# Patient Record
Sex: Male | Born: 1975 | Race: White | Hispanic: No | Marital: Married | Smoking: Former smoker
Health system: Southern US, Community
[De-identification: ages and names within clinical notes are randomized; demographics above are authoritative.]

## PROBLEM LIST (undated history)

## (undated) DIAGNOSIS — M7540 Impingement syndrome of unspecified shoulder: Secondary | ICD-10-CM

## (undated) DIAGNOSIS — Z8279 Family history of other congenital malformations, deformations and chromosomal abnormalities: Secondary | ICD-10-CM

## (undated) DIAGNOSIS — Z87442 Personal history of urinary calculi: Secondary | ICD-10-CM

## (undated) HISTORY — PX: SHOULDER ARTHROSCOPY W/ ROTATOR CUFF REPAIR: SHX2400

## (undated) HISTORY — PX: TONSILLECTOMY: SUR1361

## (undated) HISTORY — PX: KNEE ARTHROSCOPY: SUR90

---

## 2006-04-28 ENCOUNTER — Ambulatory Visit: Payer: Self-pay | Admitting: Sports Medicine

## 2008-02-28 ENCOUNTER — Ambulatory Visit: Payer: Self-pay | Admitting: Sports Medicine

## 2008-02-28 DIAGNOSIS — M79609 Pain in unspecified limb: Secondary | ICD-10-CM

## 2008-04-29 ENCOUNTER — Ambulatory Visit: Payer: Self-pay | Admitting: Sports Medicine

## 2009-08-18 ENCOUNTER — Ambulatory Visit: Payer: Self-pay | Admitting: Family Medicine

## 2009-08-18 DIAGNOSIS — M549 Dorsalgia, unspecified: Secondary | ICD-10-CM | POA: Insufficient documentation

## 2009-12-01 ENCOUNTER — Ambulatory Visit: Payer: Self-pay | Admitting: Family Medicine

## 2009-12-01 DIAGNOSIS — M7660 Achilles tendinitis, unspecified leg: Secondary | ICD-10-CM

## 2010-04-13 NOTE — Assessment & Plan Note (Signed)
Summary: Achilles INJURY,MC   History of Present Illness: 35 y/o M here for right posterior heel pain  Patient is training for a race next month and a marathon in november He runs barefoot (not with barefoot shoes). About 3-5 miles into his run today started having pain in right achilles. Is a known forefoot striker as well. Had to stop and walk due to pain Pain only when he was running today - not when walking - only mildly painful now Did not feel a pop or acute injury No swelling or bruising. Has not tried anything for this yet.  Medications Prior to Update: 1)  None  Allergies (verified): No Known Drug Allergies  Past History:  Physical Exam  General:  NAD, alert and oriented, friendly and cooperative Msk:  R foot/ankle: No gross deformity, swelling, or bruising. Achilles is intact with negative thompsons test. TTP about 2 cm prox to insertion on calcaneus - less ttp within retrocalcaneal bursa. No TTP plantar fascia Pain in achilles worsened by passive dorsiflexion at ankle. NVI distally.   Impression & Recommendations:  Problem # 1:  ACHILLES BURSITIS OR TENDINITIS (ICD-726.71) Assessment New Strain of achilles tendon without tear.  Start home exercise program, can use heel lifts in regular shoes.  Activity as tolerated - drop running intensity and distance and slowly build up about 10% at a time.  Take tomorrow off.  Cross train as well.  Icing, aleve or tylenol for pain.  Consider nitro patches if not improving.  He does not wear shoes when running so orthotics not an option - wants to continue with barefoot running instead of buying a pair of shoes.  Patient Instructions: 1)  You strained your achilles tendon. 2)  Tylenol 1000mg  three times a day and/or aleve 2 tabs twice a day to help with pain if needed. 3)  Lowering/raise on a step exercises 3 x 15 once or twice a day - two feet first then one (start with 3 x 6) 4)  Can add heel walks, toe walks forward and  backward as well. 5)  Ice bucket 10-15 minutes at end of day - can ice 3-4 times a day. 6)  Try to avoid uneven ground, hills. 7)  Heel lifts in your regular shoes. 8)  Running is ok with this to tolerance - Since this started bothering you around 3-5 miles, I would take tomorrow off then go to 2 miles - walk/jog if you have to.  Then slowly build your mileage back up.   9)  Ok to keep building up mileage as long as your pain is less than a 3/10 and you are not limping. 10)  Follow up with me in 4 weeks if not improving - there are a couple other things we can try.

## 2010-04-13 NOTE — Assessment & Plan Note (Signed)
Summary: TO SEE HUDNALL,CALF PAIN,MC   Vital Signs:  Patient profile:   35 year old male BP sitting:   145 / 90  Vitals Entered By: Lillia Pauls CMA (August 18, 2009 2:35 PM)  History of Present Illness: 35 yo M here with left calf pain and left upper back pain  1. Left calf pain Has prior history 1 year ago of calf tear States this time about 3 weeks ago he did not have an acute injury He does recall the run after this started he picked up his pace because his child started crying in the running stroller and did this for about 2 miles. Feels pain and tightness lateral left calf worse when running - pain started last run a few weeks ago about 6 miles into run so stopped has rested since then. Has been doing stretches and exercises on step he was shown previously Does not have a calf sleeve Has 1 pair of heel lifts left but he runs barefoot. No swelling or bruising in calf or left leg.  2. Upper back pain Started when backpacking > 1 week ago wearing a heavy pack Knot in between shoulder blades on left side Had massage which helped but knot returned Tender to palpation No radiation from here but does hurt mildly when turning head to left within left paraspinal area No numbness/tingling No bowel/bladder issues.  Allergies (verified): No Known Drug Allergies  Physical Exam  General:  NAD, alert and oriented, friendly and cooperative Msk:  L calf: TTP within lateral gastroc prox-mid junction.  No other TTP about knee, lower leg, or ankle. No swelling, bruising. Negative homan's sign. Able to stand on toes. FROM ankle and knee. NV intact distally.  Upper back: No gross deformity, swelling, or bruising. TTP just medial to left scapula in paraspinal thoracic region with palpable spasm. Can feel some with robbery exercise Strength 5/5 BUEs Reflexes 1+ and equal bilateral biceps, triceps, brachioradialis tendons Sensation intact to light touch.   Impression &  Recommendations:  Problem # 1:  CALF PAIN, LEFT (ICD-729.5) Assessment Deteriorated L calf strain without tear noted on ultrasound and no increased doppler flow.  Calf sleeve, eccentric and heel raise exercises and calf raises on ground with toes in, regular, and out.  Exercise as tolerated - focus on x-training.  Heel lifts, icing, avoiding hills and inclines/declines.  Orders: Garment,belt,sleeve or other covering ,elastic or similar stretch ([id number])  Problem # 2:  BACK PAIN, UPPER (ICD-724.5) Assessment: New Scapular stabilization exercises and stretches.  Massage, heat.  Patient Instructions: 1)  Wear the calf sleeve for compression 2)  Heel lifts in shoes when you wear shoes 3)  Cross train if possible with elliptical and cycling. 4)  Focus on heel raises and lowering with toes in, neutral, and pointed out. 5)  Ice up to at a time. 6)  Tylenol and/or ibuprofen for pain. 7)  Avoid hill running and incline running if possible.  Try to run on flat surfaces or flat treadmill and slowly increase your mileage over a period of 6 weeks back to where you were - make sure you are wearing the sleeve. 8)  Do a couple scapular exercises shown on the handout at the same time you do the calf exercises (lawnmower, robbery). 9)  Follow up with [place] in 6 weeks for a recheck.

## 2011-04-25 ENCOUNTER — Encounter: Payer: Self-pay | Admitting: Family Medicine

## 2011-04-25 ENCOUNTER — Ambulatory Visit (INDEPENDENT_AMBULATORY_CARE_PROVIDER_SITE_OTHER): Payer: BC Managed Care – PPO | Admitting: Family Medicine

## 2011-04-25 VITALS — BP 133/79 | HR 45 | Temp 98.1°F | Ht 70.0 in | Wt 181.0 lb

## 2011-04-25 DIAGNOSIS — M25562 Pain in left knee: Secondary | ICD-10-CM | POA: Insufficient documentation

## 2011-04-25 DIAGNOSIS — M25569 Pain in unspecified knee: Secondary | ICD-10-CM

## 2011-04-25 NOTE — Assessment & Plan Note (Signed)
2/2 patellar tendinopathy, questionable PF syndrome/developing plica, less likely PF DJD.  Reassured patient.  No injury to suggest bony injury.  Negative ligamentous and meniscal exam.  Tenderness patellar tendon reproduces his pain though grinding is posterior to patella.  Start home exercise program, icing, tylenol/nsaids as needed.  Decrease activity to about 50% - already has gained benefits from long runs up to this point and want him to be healthy as possible going in to marathon on Saturday.  Tried chopat but this did not feel comfortable.  If not improving, consider formal PT, nitro patches, plica injection depending on his exam at f/u.  See instructions for further.

## 2011-04-25 NOTE — Progress Notes (Signed)
  Subjective:    Patient ID: Vale Mousseau, male    DOB: February 21, 1976, 36 y.o.   MRN: [id number]  PCP: None  HPI 36 yo M here for left knee pain.  Patient is currently training for a marathon this Saturday. Runs 40-60 miles a week. States for past 1-2 weeks has noticed a grinding sound anterior left knee but without pain. Then yesterday started to get intermittent brief sharp stabbing pain anterior left knee about patellar tendon area, some behind kneecap. No swelling or bruising. Not icing or taking medications. Denies known injury. No catching, locking, giving out.    History reviewed. No pertinent past medical history.  No current outpatient prescriptions on file prior to visit.    Past Surgical History  Procedure Date  . Knee surgery     No Known Allergies  History   Social History  . Marital Status: Married    Spouse Name: N/A    Number of Children: N/A  . Years of Education: N/A   Occupational History  . Not on file.   Social History Main Topics  . Smoking status: Never Smoker   . Smokeless tobacco: Not on file  . Alcohol Use: Not on file  . Drug Use: Not on file  . Sexually Active: Not on file   Other Topics Concern  . Not on file   Social History Narrative  . No narrative on file    Family History  Problem Relation Age of Onset  . Sudden death Neg Hx   . Diabetes Neg Hx   . Heart attack Neg Hx   . Hyperlipidemia Neg Hx   . Hypertension Neg Hx     BP 133/79  Pulse 45  Temp(Src) 98.1 F (36.7 C) (Oral)  Ht 5\' 10"  (1.778 m)  Wt 181 lb (82.101 kg)  BMI 25.97 kg/m2  Review of Systems See HPI above.    Objective:   Physical Exam Gen: NAD  L knee: No gross deformity, ecchymoses, swelling.  Crepitation from flexion to extension (grinding posterior to patella). No TTP joint lines, post patellar facets, pes.  TTP patellar tendon reproducing pain he has anteriorly.. FROM. Negative ant/post drawers. Negative valgus/varus testing. Negative  lachmanns. Negative mcmurrays, apleys, patellar apprehension.  Mild + clarkes. NV intact distally. Hip abduction 5/5  R knee: FROM without pain, instability.    Assessment & Plan:  1. Left knee pain - 2/2 patellar tendinopathy, questionable PF syndrome/developing plica, less likely PF DJD.  Reassured patient.  No injury to suggest bony injury.  Negative ligamentous and meniscal exam.  Tenderness patellar tendon reproduces his pain though grinding is posterior to patella.  Start home exercise program, icing, tylenol/nsaids as needed.  Decrease activity to about 50% - already has gained benefits from long runs up to this point and want him to be healthy as possible going in to marathon on Saturday.  Tried chopat but this did not feel comfortable.  If not improving, consider formal PT, nitro patches, plica injection depending on his exam at f/u.  See instructions for further.

## 2011-04-25 NOTE — Patient Instructions (Signed)
You have patellar tendinitis and developing plica (band of soft tissue behind side of kneecap that crunches and catches). Avoid painful activities when possible In general cut your typical runs by 50% - increase mileage by about 10% per week while doing the rehab. If pain is less than 3 on a scale of 1-10 and you're not limping, can continue to progress running. Tylenol and/or aleve as needed for pain Icing 15 minutes at a time after activities. For rehab - decline squat, straight leg raise, inside straight leg raise, hip abduction 3 sets of 10 once a day. Can consider cortisone injection to area where plica is rubbing (I generally do not recommend this), formal physical therapy, nitro patches if not improving as expected. Follow up with me in 1 month or as needed. Good luck in the race on Saturday!

## 2012-04-14 DIAGNOSIS — Z87442 Personal history of urinary calculi: Secondary | ICD-10-CM

## 2012-04-14 HISTORY — DX: Personal history of urinary calculi: Z87.442

## 2012-08-08 ENCOUNTER — Ambulatory Visit (HOSPITAL_BASED_OUTPATIENT_CLINIC_OR_DEPARTMENT_OTHER)
Admission: RE | Admit: 2012-08-08 | Discharge: 2012-08-08 | Disposition: A | Discharge status: Home / Self Care | Payer: BC Managed Care – PPO | Source: Ambulatory Visit | Attending: Family Medicine | Admitting: Family Medicine

## 2012-08-08 ENCOUNTER — Ambulatory Visit (INDEPENDENT_AMBULATORY_CARE_PROVIDER_SITE_OTHER): Payer: BC Managed Care – PPO | Admitting: Family Medicine

## 2012-08-08 ENCOUNTER — Encounter: Payer: Self-pay | Admitting: Family Medicine

## 2012-08-08 VITALS — BP 148/87 | HR 49 | Ht 70.0 in | Wt 195.0 lb

## 2012-08-08 DIAGNOSIS — M25511 Pain in right shoulder: Secondary | ICD-10-CM

## 2012-08-08 DIAGNOSIS — S43431A Superior glenoid labrum lesion of right shoulder, initial encounter: Secondary | ICD-10-CM | POA: Insufficient documentation

## 2012-08-08 DIAGNOSIS — M25519 Pain in unspecified shoulder: Secondary | ICD-10-CM

## 2012-08-08 DIAGNOSIS — M19019 Primary osteoarthritis, unspecified shoulder: Secondary | ICD-10-CM | POA: Insufficient documentation

## 2012-08-08 NOTE — Patient Instructions (Addendum)
Your history and exam are concerning for right shoulder instability (subluxation) and labral tear. Get x-rays today then we will do an MRI arthrogram to assess for these issues. In meantime ice shoulder 15 minutes at a time as needed. Ibuprofen 600mg  three times a day with food OR aleve 2 tabs twice a day with food for pain and inflammation. Avoid swimming but it's ok to continue running. Start home exercise program - 3 sets of 10 each exercise and do only once daily. Check with your insurance about % covered and if they have a preferred imaging place locally [facility], [facility], [facility], [facility] are the common ones in the area). Procedure code for the MRI is 73222; ICD-9 code is 719.41.

## 2012-08-08 NOTE — Progress Notes (Addendum)
Subjective:    Patient ID: Christian Horne, male    DOB: 02-07-1976, 37 y.o.   MRN: [id number]  PCP: None  Shoulder Pain    37 yo M here for right shoulder pain.  Patient reports in July 2013 while swimming he felt a loud pop in right shoulder. A lot of pain and could not use shoulder for over a week due to this. Didn't resolve for 1-2 months. Had 3 other injuries since then - backpacking February 2014 it popped again causing pain, inability to use shoulder for over a week. Then a couple times since including this weekend while swimming gets a deep pain in right shoulder, unable to use arm. No swelling or bruising. Since initial injury gets catching, locking, clicking of shoulder. Decreased strength in right shoulder, difficulty lying on right side. Is right handed.  History reviewed. No pertinent past medical history.  No current outpatient prescriptions on file prior to visit.   No current facility-administered medications on file prior to visit.    Past Surgical History  Procedure Laterality Date  . Knee surgery      No Known Allergies  History   Social History  . Marital Status: Married    Spouse Name: N/A    Number of Children: N/A  . Years of Education: N/A   Occupational History  . Not on file.   Social History Main Topics  . Smoking status: Former Smoker    Quit date: 03/14/2000  . Smokeless tobacco: Not on file  . Alcohol Use: Not on file  . Drug Use: Not on file  . Sexually Active: Not on file   Other Topics Concern  . Not on file   Social History Narrative  . No narrative on file    Family History  Problem Relation Age of Onset  . Sudden death Neg Hx   . Diabetes Neg Hx   . Heart attack Neg Hx   . Hyperlipidemia Neg Hx   . Hypertension Neg Hx     BP 148/87  Pulse 49  Ht 5\' 10"  (1.778 m)  Wt 195 lb (88.451 kg)  BMI 27.98 kg/m2  Review of Systems  See HPI above.    Objective:   Physical Exam  Gen: NAD  R shoulder: No swelling,  ecchymoses.  No gross deformity. No TTP AC joint, biceps tendon. FROM with mild painful arc. Negative Hawkins, Neers. Negative Speeds, Yergasons. Strength 5/5 with empty can and resisted internal/external rotation.  Minimal pain with IR and empty can. Positive o'briens Pain with apprehension. NV intact distally.     Assessment & Plan:  1. Right shoulder pain - Based on his history and exam (starting with pop and acute injury while swimming) and subsequent injuries, popping, clicking deep in shoulder this is concerning for a shoulder subluxation with labral tear.  He has done some home exercises on own but struggles with persistent issues.  Discussed options (PT, conservative care vs imaging) and advised we go ahead with MR arthrogram to assess for bankart lesion, labral tear.  Radiographs negative for acute bony abnormalities.  Addendum 6/9:  MRI results reviewed and discussed with patient.  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  Also with a small labral tear anteriorly - these all in the area most commonly associated with subluxation/dislocation.  Has some loose bodies that are in the subcoracoid recess, are very small.  Discussed his options.  He  reports pain has worsened over the weekend simply doing one day of the theraband strengthening exercises (3 exercises, 3 sets of 10) with increased catching and locked at one point.  Discussed intraarticular injection, physical therapy, referral to shoulder specialist.  Given what he describes, mechanical symptoms, and great difficulty with home exercises will go ahead with referral to discuss next steps, possible arthroscopy.

## 2012-08-08 NOTE — Assessment & Plan Note (Signed)
Based on his history and exam (starting with pop and acute injury while swimming) and subsequent injuries, popping, clicking deep in shoulder this is concerning for a shoulder subluxation with labral tear.  He has done some home exercises on own but struggles with persistent issues.  Discussed options (PT, conservative care vs imaging) and advised we go ahead with MR arthrogram to assess for bankart lesion, labral tear.  Radiographs negative for acute bony abnormalities.

## 2012-08-13 ENCOUNTER — Other Ambulatory Visit (HOSPITAL_COMMUNITY): Payer: BC Managed Care – PPO

## 2012-08-17 ENCOUNTER — Ambulatory Visit (HOSPITAL_COMMUNITY)
Admission: RE | Admit: 2012-08-17 | Discharge: 2012-08-17 | Disposition: A | Discharge status: Home / Self Care | Payer: BC Managed Care – PPO | Source: Ambulatory Visit | Attending: Family Medicine | Admitting: Family Medicine

## 2012-08-17 DIAGNOSIS — M24819 Other specific joint derangements of unspecified shoulder, not elsewhere classified: Secondary | ICD-10-CM | POA: Insufficient documentation

## 2012-08-17 DIAGNOSIS — M25511 Pain in right shoulder: Secondary | ICD-10-CM

## 2012-08-17 DIAGNOSIS — M25519 Pain in unspecified shoulder: Secondary | ICD-10-CM | POA: Insufficient documentation

## 2012-08-17 DIAGNOSIS — M19019 Primary osteoarthritis, unspecified shoulder: Secondary | ICD-10-CM | POA: Insufficient documentation

## 2012-08-17 MED ORDER — GADOBENATE DIMEGLUMINE 529 MG/ML IV SOLN
5.0000 mL | Freq: Once | INTRAVENOUS | Status: AC | PRN
  Administered 2012-08-17: 5 mL via INTRAVENOUS

## 2012-08-17 MED ORDER — GADOBENATE DIMEGLUMINE 529 MG/ML IV SOLN
5.0000 mL | Freq: Once | INTRAVENOUS | Status: AC | PRN
  Administered 2012-08-17: 1 mL via INTRAVENOUS

## 2012-08-17 MED ORDER — IOHEXOL 300 MG/ML  SOLN
50.0000 mL | Freq: Once | INTRAMUSCULAR | Status: AC | PRN
  Administered 2012-08-17: 10 mL

## 2012-08-20 ENCOUNTER — Other Ambulatory Visit: Payer: Self-pay | Admitting: *Deleted

## 2012-08-20 MED ORDER — HYDROCODONE-ACETAMINOPHEN 5-325 MG PO TABS: 1.0000 | ORAL_TABLET | Freq: Four times a day (QID) | ORAL | Status: DC | PRN

## 2012-08-20 NOTE — Addendum Note (Signed)
Addended by: Lenda Kelp on: 08/20/2012 09:34 AM   Modules accepted: Orders

## 2012-10-10 ENCOUNTER — Other Ambulatory Visit: Payer: Self-pay | Admitting: Orthopedic Surgery

## 2012-10-12 DIAGNOSIS — M7540 Impingement syndrome of unspecified shoulder: Secondary | ICD-10-CM

## 2012-10-12 HISTORY — DX: Impingement syndrome of unspecified shoulder: M75.40

## 2012-10-19 ENCOUNTER — Encounter (HOSPITAL_BASED_OUTPATIENT_CLINIC_OR_DEPARTMENT_OTHER): Payer: Self-pay | Admitting: *Deleted

## 2012-10-26 ENCOUNTER — Ambulatory Visit (HOSPITAL_BASED_OUTPATIENT_CLINIC_OR_DEPARTMENT_OTHER): Payer: BC Managed Care – PPO | Admitting: Anesthesiology

## 2012-10-26 ENCOUNTER — Encounter (HOSPITAL_BASED_OUTPATIENT_CLINIC_OR_DEPARTMENT_OTHER)
Admission: RE | Disposition: A | Discharge status: Home / Self Care | Payer: Self-pay | Source: Ambulatory Visit | Attending: Orthopedic Surgery

## 2012-10-26 ENCOUNTER — Encounter (HOSPITAL_BASED_OUTPATIENT_CLINIC_OR_DEPARTMENT_OTHER): Payer: Self-pay | Admitting: Anesthesiology

## 2012-10-26 ENCOUNTER — Ambulatory Visit (HOSPITAL_BASED_OUTPATIENT_CLINIC_OR_DEPARTMENT_OTHER)
Admission: RE | Admit: 2012-10-26 | Discharge: 2012-10-26 | Disposition: A | Discharge status: Home / Self Care | Payer: BC Managed Care – PPO | Source: Ambulatory Visit | Attending: Orthopedic Surgery | Admitting: Orthopedic Surgery

## 2012-10-26 DIAGNOSIS — X58XXXA Exposure to other specified factors, initial encounter: Secondary | ICD-10-CM | POA: Insufficient documentation

## 2012-10-26 DIAGNOSIS — Z87891 Personal history of nicotine dependence: Secondary | ICD-10-CM | POA: Insufficient documentation

## 2012-10-26 DIAGNOSIS — S43439A Superior glenoid labrum lesion of unspecified shoulder, initial encounter: Secondary | ICD-10-CM | POA: Insufficient documentation

## 2012-10-26 DIAGNOSIS — Y929 Unspecified place or not applicable: Secondary | ICD-10-CM | POA: Insufficient documentation

## 2012-10-26 DIAGNOSIS — S43431A Superior glenoid labrum lesion of right shoulder, initial encounter: Secondary | ICD-10-CM

## 2012-10-26 DIAGNOSIS — M24019 Loose body in unspecified shoulder: Secondary | ICD-10-CM | POA: Insufficient documentation

## 2012-10-26 DIAGNOSIS — M19019 Primary osteoarthritis, unspecified shoulder: Secondary | ICD-10-CM | POA: Insufficient documentation

## 2012-10-26 HISTORY — DX: Personal history of urinary calculi: Z87.442

## 2012-10-26 HISTORY — DX: Family history of other congenital malformations, deformations and chromosomal abnormalities: Z82.79

## 2012-10-26 HISTORY — PX: SHOULDER ARTHROSCOPY: SHX128

## 2012-10-26 HISTORY — DX: Impingement syndrome of unspecified shoulder: M75.40

## 2012-10-26 LAB — POCT HEMOGLOBIN-HEMACUE: Hemoglobin: 14.9 g/dL (ref 13.0–17.0)

## 2012-10-26 SURGERY — ARTHROSCOPY, SHOULDER
Anesthesia: General | Site: Shoulder | Laterality: Right | Wound class: Clean

## 2012-10-26 MED ORDER — PROMETHAZINE HCL 25 MG PO TABS: 25.0000 mg | ORAL_TABLET | Freq: Four times a day (QID) | ORAL | Status: AC | PRN

## 2012-10-26 MED ORDER — LACTATED RINGERS IV SOLN
INTRAVENOUS | Status: DC
  Administered 2012-10-26: 09:00:00 via INTRAVENOUS

## 2012-10-26 MED ORDER — OXYCODONE-ACETAMINOPHEN 5-325 MG PO TABS: 1.0000 | ORAL_TABLET | Freq: Four times a day (QID) | ORAL | Status: AC | PRN

## 2012-10-26 MED ORDER — SENNA-DOCUSATE SODIUM 8.6-50 MG PO TABS: 2.0000 | ORAL_TABLET | Freq: Every day | ORAL | Status: AC

## 2012-10-26 MED ORDER — FENTANYL CITRATE 0.05 MG/ML IJ SOLN
50.0000 ug | INTRAMUSCULAR | Status: DC | PRN
  Administered 2012-10-26: 100 ug via INTRAVENOUS

## 2012-10-26 MED ORDER — METHOCARBAMOL 500 MG PO TABS: 500.0000 mg | ORAL_TABLET | Freq: Four times a day (QID) | ORAL | Status: DC

## 2012-10-26 MED ORDER — SODIUM CHLORIDE 0.9 % IR SOLN
Status: DC | PRN
  Administered 2012-10-26: 6000 mL

## 2012-10-26 MED ORDER — CEFAZOLIN SODIUM-DEXTROSE 2-3 GM-% IV SOLR
2.0000 g | INTRAVENOUS | Status: AC
  Administered 2012-10-26: 2 g via INTRAVENOUS

## 2012-10-26 MED ORDER — BUPIVACAINE-EPINEPHRINE PF 0.5-1:200000 % IJ SOLN
INTRAMUSCULAR | Status: DC | PRN
  Administered 2012-10-26: 25 mL

## 2012-10-26 MED ORDER — MIDAZOLAM HCL 2 MG/2ML IJ SOLN
1.0000 mg | INTRAMUSCULAR | Status: DC | PRN
  Administered 2012-10-26: 2 mg via INTRAVENOUS

## 2012-10-26 MED ORDER — SUCCINYLCHOLINE CHLORIDE 20 MG/ML IJ SOLN
INTRAMUSCULAR | Status: DC | PRN
  Administered 2012-10-26: 100 mg via INTRAVENOUS

## 2012-10-26 MED ORDER — PROPOFOL 10 MG/ML IV BOLUS
INTRAVENOUS | Status: DC | PRN
  Administered 2012-10-26: 100 mg via INTRAVENOUS
  Administered 2012-10-26: 200 mg via INTRAVENOUS

## 2012-10-26 MED ORDER — MIDAZOLAM HCL 2 MG/ML PO SYRP: 12.0000 mg | ORAL_SOLUTION | Freq: Once | ORAL | Status: DC | PRN

## 2012-10-26 MED ORDER — DEXAMETHASONE SODIUM PHOSPHATE 4 MG/ML IJ SOLN
INTRAMUSCULAR | Status: DC | PRN
  Administered 2012-10-26: 10 mg via INTRAVENOUS
  Administered 2012-10-26: 4 mg

## 2012-10-26 MED ORDER — LIDOCAINE HCL (CARDIAC) 20 MG/ML IV SOLN
INTRAVENOUS | Status: DC | PRN
  Administered 2012-10-26: 80 mg via INTRAVENOUS

## 2012-10-26 SURGICAL SUPPLY — 64 items
BENZOIN TINCTURE PRP APPL 2/3 (GAUZE/BANDAGES/DRESSINGS) ×2 IMPLANT
BLADE CUTTER GATOR 3.5 (BLADE) ×2 IMPLANT
BLADE GREAT WHITE 4.2 (BLADE) IMPLANT
BLADE SURG 15 STRL LF DISP TIS (BLADE) ×1 IMPLANT
BLADE SURG 15 STRL SS (BLADE) ×1
BUR OVAL 4.0 (BURR) IMPLANT
BUR OVAL 6.0 (BURR) IMPLANT
CANISTER OMNI JUG 16 LITER (MISCELLANEOUS) ×2 IMPLANT
CANNULA 5.75X71 LONG (CANNULA) ×2 IMPLANT
CANNULA TWIST IN 8.25X7CM (CANNULA) IMPLANT
CLOTH BEACON ORANGE TIMEOUT ST (SAFETY) ×2 IMPLANT
DECANTER SPIKE VIAL GLASS SM (MISCELLANEOUS) IMPLANT
DRAPE INCISE IOBAN 66X45 STRL (DRAPES) ×2 IMPLANT
DRAPE SHOULDER BEACH CHAIR (DRAPES) ×2 IMPLANT
DRAPE U 20/CS (DRAPES) ×2 IMPLANT
DRAPE U-SHAPE 47X51 STRL (DRAPES) ×2 IMPLANT
DRSG PAD ABDOMINAL 8X10 ST (GAUZE/BANDAGES/DRESSINGS) ×2 IMPLANT
DURAPREP 26ML APPLICATOR (WOUND CARE) ×2 IMPLANT
ELECT REM PT RETURN 9FT ADLT (ELECTROSURGICAL)
ELECTRODE REM PT RTRN 9FT ADLT (ELECTROSURGICAL) IMPLANT
GLOVE BIO SURGEON STRL SZ8 (GLOVE) ×4 IMPLANT
GLOVE BIOGEL PI IND STRL 7.0 (GLOVE) ×1 IMPLANT
GLOVE BIOGEL PI IND STRL 8 (GLOVE) ×2 IMPLANT
GLOVE BIOGEL PI INDICATOR 7.0 (GLOVE) ×1
GLOVE BIOGEL PI INDICATOR 8 (GLOVE) ×2
GLOVE ECLIPSE 6.5 STRL STRAW (GLOVE) ×2 IMPLANT
GLOVE EXAM NITRILE LRG STRL (GLOVE) ×2 IMPLANT
GLOVE ORTHO TXT STRL SZ7.5 (GLOVE) ×2 IMPLANT
GOWN BRE IMP PREV XXLGXLNG (GOWN DISPOSABLE) ×4 IMPLANT
GOWN PREVENTION PLUS XLARGE (GOWN DISPOSABLE) ×2 IMPLANT
IMMOBILIZER SHOULDER XLGE (ORTHOPEDIC SUPPLIES) IMPLANT
IV NS IRRIG 3000ML ARTHROMATIC (IV SOLUTION) ×4 IMPLANT
KIT SHOULDER TRACTION (DRAPES) ×2 IMPLANT
LASSO SUT 90 DEGREE (SUTURE) IMPLANT
PACK ARTHROSCOPY DSU (CUSTOM PROCEDURE TRAY) ×2 IMPLANT
PACK BASIN DAY SURGERY FS (CUSTOM PROCEDURE TRAY) ×2 IMPLANT
SET ARTHROSCOPY TUBING (MISCELLANEOUS) ×1
SET ARTHROSCOPY TUBING LN (MISCELLANEOUS) ×1 IMPLANT
SHEET MEDIUM DRAPE 40X70 STRL (DRAPES) ×2 IMPLANT
SLEEVE SCD COMPRESS KNEE MED (MISCELLANEOUS) ×2 IMPLANT
SLING ARM FOAM STRAP LRG (SOFTGOODS) IMPLANT
SLING ARM FOAM STRAP MED (SOFTGOODS) IMPLANT
SLING ARM FOAM STRAP XLG (SOFTGOODS) IMPLANT
SLING ARM IMMOBILIZER LRG (SOFTGOODS) ×2 IMPLANT
SLING ARM IMMOBILIZER MED (SOFTGOODS) IMPLANT
SPONGE GAUZE 4X4 12PLY (GAUZE/BANDAGES/DRESSINGS) ×2 IMPLANT
STRIP CLOSURE SKIN 1/2X4 (GAUZE/BANDAGES/DRESSINGS) ×2 IMPLANT
SUT FIBERWIRE #2 38 T-5 BLUE (SUTURE)
SUT LASSO 45 DEGREE (SUTURE) IMPLANT
SUT LASSO 45 DEGREE LEFT (SUTURE) IMPLANT
SUT LASSO 45D RIGHT (SUTURE) IMPLANT
SUT MNCRL AB 4-0 PS2 18 (SUTURE) IMPLANT
SUT PDS AB 1 CT  36 (SUTURE)
SUT PDS AB 1 CT 36 (SUTURE) IMPLANT
SUT TIGER TAPE 7 IN WHITE (SUTURE) IMPLANT
SUT VIC AB 3-0 SH 27 (SUTURE)
SUT VIC AB 3-0 SH 27X BRD (SUTURE) IMPLANT
SUTURE FIBERWR #2 38 T-5 BLUE (SUTURE) IMPLANT
TAPE FIBER 2MM 7IN #2 BLUE (SUTURE) IMPLANT
TOWEL OR 17X24 6PK STRL BLUE (TOWEL DISPOSABLE) ×2 IMPLANT
TOWEL OR NON WOVEN STRL DISP B (DISPOSABLE) ×2 IMPLANT
TUBE CONNECTING 20X1/4 (TUBING) IMPLANT
WAND STAR VAC 90 (SURGICAL WAND) ×2 IMPLANT
WATER STERILE IRR 1000ML POUR (IV SOLUTION) ×2 IMPLANT

## 2012-10-26 NOTE — Progress Notes (Signed)
Assisted Dr. Crews with right, ultrasound guided, interscalene  block. Side rails up, monitors on throughout procedure. See vital signs in flow sheet. Tolerated Procedure well. 

## 2012-10-26 NOTE — Anesthesia Procedure Notes (Addendum)
Anesthesia Regional Block:  Interscalene brachial plexus block  Pre-Anesthetic Checklist: ,, timeout performed, Correct Patient, Correct Site, Correct Laterality, Correct Procedure, Correct Position, site marked, Risks and benefits discussed,  Surgical consent,  Pre-op evaluation,  At surgeon's request and post-op pain management  Laterality: Right and Upper  Prep: chloraprep       Needles:  Injection technique: Single-shot  Needle Type: Echogenic Needle     Needle Length: 5cm 5 cm Needle Gauge: 21    Additional Needles:  Procedures: ultrasound guided (picture in chart) Interscalene brachial plexus block Narrative:  Start time: 10/26/2012 10:19 AM End time: 10/26/2012 10:25 AM Injection made incrementally with aspirations every 5 mL.  Performed by: Personally  Anesthesiologist: Sheldon Silvan, MD  Interscalene brachial plexus block Procedure Name: Intubation Performed by: Burna Cash Pre-anesthesia Checklist: Patient identified, Emergency Drugs available, Suction available and Patient being monitored Patient Re-evaluated:Patient Re-evaluated prior to inductionOxygen Delivery Method: Circle System Utilized Preoxygenation: Pre-oxygenation with 100% oxygen Intubation Type: IV induction Ventilation: Mask ventilation without difficulty Tube type: Oral Number of attempts: 1 Airway Equipment and Method: stylet and oral airway Placement Confirmation: ETT inserted through vocal cords under direct vision,  positive ETCO2 and breath sounds checked- equal and bilateral Tube secured with: Tape Dental Injury: Teeth and Oropharynx as per pre-operative assessment     Procedure Name: Intubation Date/Time: 10/26/2012 11:11 AM Performed by: Burna Cash Pre-anesthesia Checklist: Patient identified, Emergency Drugs available, Suction available and Patient being monitored Patient Re-evaluated:Patient Re-evaluated prior to inductionOxygen Delivery Method: Circle System  Utilized Preoxygenation: Pre-oxygenation with 100% oxygen Intubation Type: IV induction Ventilation: Mask ventilation without difficulty Laryngoscope Size: Mac and 3 Grade View: Grade I Tube type: Oral Number of attempts: 1 Airway Equipment and Method: stylet and oral airway Placement Confirmation: ETT inserted through vocal cords under direct vision,  positive ETCO2 and breath sounds checked- equal and bilateral Secured at: 23 cm Tube secured with: Tape Dental Injury: Teeth and Oropharynx as per pre-operative assessment

## 2012-10-26 NOTE — Anesthesia Preprocedure Evaluation (Signed)

## 2012-10-26 NOTE — Transfer of Care (Signed)
Immediate Anesthesia Transfer of Care Note  Patient: Christian Horne  Procedure(s) Performed: Procedure(s): RIGHT ARTHROSCOPY SHOULDER WITH EXTENSIVE DEBRIDEMENT (Right)  Patient Location: PACU  Anesthesia Type:General  Level of Consciousness: awake  Airway & Oxygen Therapy: Patient Spontanous Breathing and Patient connected to face mask oxygen  Post-op Assessment: Report given to PACU RN and Post -op Vital signs reviewed and stable  Post vital signs: Reviewed and stable  Complications: No apparent anesthesia complications

## 2012-10-26 NOTE — H&P (Signed)
  PREOPERATIVE H&P  Chief Complaint: RIGHT SHOULDER PAIN  HPI: Christian Horne is a 37 y.o. 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 Symptoms are rated as moderate to severe, and have been worsening.  This is significantly impairing activities of daily living.  He has elected for surgical management.   Past Medical History  Diagnosis Date  . 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 History of kidney stones 04/2012  . Family history of neurofibromatosis    Past Surgical History  Procedure Laterality Date  . Shoulder arthroscopy w/ rotator cuff repair Left   . Knee arthroscopy Bilateral    History   Social History  . Marital Status: Married    Spouse Name: N/A    Number of Children: N/A  . Years of Education: N/A   Social History Main Topics  . Smoking status: Former Smoker    Quit date: 03/14/2000  . Smokeless tobacco: Never Used  . Alcohol Use: Yes     Comment: occasionally  . Drug Use: No  . Sexual Activity: None   Other Topics Concern  . None   Social History Narrative  . None   History reviewed. No pertinent family history. No Known Allergies Prior to Admission medications   Medication Sig Start Date End Date Taking? Authorizing Provider  HYDROcodone-acetaminophen (NORCO) 5-325 MG per tablet Take 1 tablet by mouth every 6 (six) hours as needed for pain. 08/20/12  Yes Lenda Kelp, MD     Positive ROS: All other systems have been reviewed and were otherwise negative with the exception of those mentioned in the HPI and as above.  Physical Exam: General: Alert, no acute distress Cardiovascular: No pedal edema Respiratory: No cyanosis, no use of accessory musculature GI: No organomegaly, abdomen is soft and non-tender Skin: No lesions in the area of chief complaint Neurologic: Sensation intact distally Psychiatric: Patient is competent for consent  with normal mood and affect Lymphatic: No axillary or cervical lymphadenopathy  MUSCULOSKELETAL: Right shoulder active motion is 0-160, but with positive impingement signs and pain both actively and passively. Rotator cuff strength is intact.  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 We also discussed the risks for incomplete relief of symptoms, progression of degenerative disease, among others.  Eulas Post, MD Cell [phone]   10/26/2012 10:38 AM

## 2012-10-26 NOTE — Anesthesia Postprocedure Evaluation (Signed)
  Anesthesia Post-op Note  Patient: Christian Horne  Procedure(s) Performed: Procedure(s): RIGHT ARTHROSCOPY SHOULDER WITH EXTENSIVE DEBRIDEMENT (Right)  Patient Location: PACU  Anesthesia Type:GA combined with regional for post-op pain  Level of Consciousness: awake, alert  and oriented  Airway and Oxygen Therapy: Patient Spontanous Breathing  Post-op Pain: none  Post-op Assessment: Post-op Vital signs reviewed  Post-op Vital Signs: Reviewed  Complications: No apparent anesthesia complications

## 2012-10-26 NOTE — Op Note (Signed)
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  ANESTHESIA:   General  PREOPERATIVE INDICATIONS:  Christian Horne is a  37 y.o. male with a diagnosis of RIGHT SHOULDER PAIN who failed conservative measures and elected for surgical management.    The risks benefits and alternatives were discussed with the patient preoperatively including but not limited to the risks of infection, bleeding, nerve injury, cardiopulmonary complications, the need for revision surgery, among others, and the patient was willing to proceed. We also discussed the limitations of arthroscopy in the setting of osteoarthritis, and the potential for persistent pain and stiffness.  OPERATIVE IMPLANTS: None  OPERATIVE FINDINGS: The glenohumeral articular cartilage had grade 4 changes in uncontained pattern on the glenoid, with a area on the humeral head of grade 4 changes, that was about 2 x 2 centimeters. The rotator cuff was intact from the articular and bursal side. The CA ligament was pristine. The biceps tendon was intact, although there was a complex tear of the anterosuperior labrum that had a bucket handle component that was flipped down into the joint. The posterior labrum had some fraying as well. There were multiple loose fragments floating within the joint.  OPERATIVE PROCEDURE: The patient is brought to the operating room and placed in the supine position. General anesthesia was administered. IV antibiotics were  given. Examination under anesthesia demonstrated essentially full motion. The right shoulder was prepped and draped in usual sterile fashion. Semilateral decubitus position was utilized. 10 pounds of traction were utilized, and halfway through the case I increased to 15 pounds to improve the subacromial space access.  Time out was performed, and then a diagnostic arthroscopy was carried out with the above-named findings. I used the arthroscopic shaver to debride and remove any loose chondral fragments, and also to remove the torn anterior labral tissue. I also debrided the posterior labrum, and also multiple loose chondral flaps on both the articular surface of the humerus and the glenoid.  I switched portals, viewing from the anterior portal, and confirmed that we had removed all of the loose chondral flap tissue from throughout the articular space.  The biceps anchor did have evidence for tearing up into the anchor itself, which was debrided. I did not feel that stabilization of his superior labrum would be in his best interest given the extensive degenerative changes seen within the glenohumeral joint.  I then turned my attention to the subacromial space. The CA ligament was examined, and was found to be pristine. I did utilize a small lateral portal, and performed a light bursectomy. I did not perform an acromioplasty, given the fact that it appeared that his primary process was osteoarthritis with labral tearing, and in the absence of evidence for rotator cuff impingement from the bursal side, I did not think that acromioplasty would be of benefit. I did however remove thickened bursa that was overlying the rotator cuff.  The arthroscopic instruments were then removed, and the portals closed with Monocryl followed by Steri-Strips and sterile gauze.  The patient was awakened and returned to the PACU in stable  and satisfactory condition. There no complications. He tolerated the procedure well.

## 2012-10-29 ENCOUNTER — Encounter (HOSPITAL_BASED_OUTPATIENT_CLINIC_OR_DEPARTMENT_OTHER): Payer: Self-pay | Admitting: Orthopedic Surgery

## 2013-01-17 ENCOUNTER — Other Ambulatory Visit: Payer: Self-pay

## 2015-08-18 ENCOUNTER — Emergency Department (HOSPITAL_BASED_OUTPATIENT_CLINIC_OR_DEPARTMENT_OTHER)
Admission: EM | Admit: 2015-08-18 | Discharge: 2015-08-18 | Disposition: A | Discharge status: Home / Self Care | Payer: BC Managed Care – PPO | Attending: Emergency Medicine | Admitting: Emergency Medicine

## 2015-08-18 ENCOUNTER — Encounter (HOSPITAL_BASED_OUTPATIENT_CLINIC_OR_DEPARTMENT_OTHER): Payer: Self-pay | Admitting: Emergency Medicine

## 2015-08-18 DIAGNOSIS — Z87891 Personal history of nicotine dependence: Secondary | ICD-10-CM | POA: Insufficient documentation

## 2015-08-18 DIAGNOSIS — R42 Dizziness and giddiness: Secondary | ICD-10-CM | POA: Diagnosis present

## 2015-08-18 DIAGNOSIS — R531 Weakness: Secondary | ICD-10-CM | POA: Diagnosis not present

## 2015-08-18 LAB — CBC WITH DIFFERENTIAL/PLATELET
Basophils Absolute: 0 10*3/uL (ref 0.0–0.1)
Basophils Relative: 0 %
EOS ABS: 0.1 10*3/uL (ref 0.0–0.7)
EOS PCT: 1 %
HCT: 42.3 % (ref 39.0–52.0)
Hemoglobin: 14.5 g/dL (ref 13.0–17.0)
LYMPHS ABS: 3.5 10*3/uL (ref 0.7–4.0)
LYMPHS PCT: 40 %
MCH: 30.5 pg (ref 26.0–34.0)
MCHC: 34.3 g/dL (ref 30.0–36.0)
MCV: 88.9 fL (ref 78.0–100.0)
MONOS PCT: 9 %
Monocytes Absolute: 0.8 10*3/uL (ref 0.1–1.0)
Neutro Abs: 4.3 10*3/uL (ref 1.7–7.7)
Neutrophils Relative %: 50 %
PLATELETS: 236 10*3/uL (ref 150–400)
RBC: 4.76 MIL/uL (ref 4.22–5.81)
RDW: 13.5 % (ref 11.5–15.5)
WBC: 8.7 10*3/uL (ref 4.0–10.5)

## 2015-08-18 LAB — COMPREHENSIVE METABOLIC PANEL
ALBUMIN: 4.4 g/dL (ref 3.5–5.0)
ALT: 35 U/L (ref 17–63)
AST: 24 U/L (ref 15–41)
Alkaline Phosphatase: 83 U/L (ref 38–126)
Anion gap: 8 (ref 5–15)
BUN: 18 mg/dL (ref 6–20)
CHLORIDE: 103 mmol/L (ref 101–111)
CO2: 27 mmol/L (ref 22–32)
CREATININE: 1.01 mg/dL (ref 0.61–1.24)
Calcium: 9.4 mg/dL (ref 8.9–10.3)
GFR calc Af Amer: >60 mL/min (ref >60)
GFR calc non Af Amer: >60 mL/min (ref >60)
GLUCOSE: 98 mg/dL (ref 65–99)
POTASSIUM: 3.5 mmol/L (ref 3.5–5.1)
SODIUM: 138 mmol/L (ref 135–145)
Total Bilirubin: 0.4 mg/dL (ref 0.3–1.2)
Total Protein: 7.4 g/dL (ref 6.5–8.1)

## 2015-08-18 LAB — CBG MONITORING, ED: GLUCOSE-CAPILLARY: 105 mg/dL — AB (ref 65–99)

## 2015-08-18 NOTE — ED Notes (Signed)
MD at bedside. 

## 2015-08-18 NOTE — ED Notes (Signed)
Patient states that he was driving home when he notices that he become acutely dizzy - this was about 2 hours ago

## 2015-08-18 NOTE — ED Provider Notes (Signed)
CSN: [id number]     Arrival date & time 08/18/15  1943 History  By signing my name below, I, Christian Horne and Christian Horne, attest that this documentation has been prepared under the direction and in the presence of Doug SouSam Christian Urquiza, MD. Electronically Signed: Octavia HeirArianna Horne, ED Scribe. 08/18/2015. 8:52 PM.  Chief Complaint  Patient presents with  . Dizziness     The history is provided by the patient. No language interpreter was used.   HPI Comments: Montine CircleChris Horne is a 40 y.o. male with a PMHx of kidney stones, knee and shoulder surgery who presents to the Emergency Department complaining of  non-changing, moderate, lightheadedness onset around 4 hours ago. The symptoms  began when the patient while the patient was driving. He states that he feels disoriented and very foggy-like. The pt does not list any modifying factors or aggravating factors for his light-headedness. No chest pain no shortness of breath. No other associated symptoms. No treatment prior to coming here. He denies pain anywhere. The patient has eaten well today including: hamburgers and granola bars. Pt is a non-smoker and he is not allergic to any medications. The patient denies having nausea, drug use, and any current pain. He is an occasional drinker.   Past Medical History  Diagnosis Date  . 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 History of kidney stones 04/2012  . Family history of neurofibromatosis    Past Surgical History  Procedure Laterality Date  . Shoulder arthroscopy w/ rotator cuff repair Left   . Knee arthroscopy Bilateral   . Shoulder arthroscopy Right 10/26/2012    Procedure: RIGHT ARTHROSCOPY SHOULDER WITH EXTENSIVE DEBRIDEMENT;  Surgeon: Eulas PostJoshua P Landau, MD;  Location: [facility];  Service: Orthopedics;  Laterality: Right;   History reviewed. No pertinent family history. Social History  Substance Use Topics  . Smoking status: Former Smoker    Quit date: 03/14/2000  . Smokeless  tobacco: Never Used  . Alcohol Use: Yes     Comment: occasionally    Review of Systems  Constitutional: Negative.   HENT: Negative.   Respiratory: Negative.   Cardiovascular:       Patient reports he normally runs bradycardic with heart rate of 39-40. He is a runner and athletic  Gastrointestinal: Negative.   Musculoskeletal: Negative.   Skin: Negative.   Neurological: Positive for light-headedness.  Psychiatric/Behavioral: Negative.   All other systems reviewed and are negative.     Allergies  Review of patient's allergies indicates no known allergies.  Home Medications   Prior to Admission medications   Medication Sig Start Date End Date Taking? Authorizing Provider  methocarbamol (ROBAXIN) 500 MG tablet Take 1 tablet (500 mg total) by mouth 4 (four) times daily. 10/26/12   Christian LucyJoshua Landau, MD  oxyCODONE-acetaminophen (ROXICET) 5-325 MG per tablet Take 1-2 tablets by mouth every 6 (six) hours as needed for pain. 10/26/12   Christian LucyJoshua Landau, MD  promethazine (PHENERGAN) 25 MG tablet Take 1 tablet (25 mg total) by mouth every 6 (six) hours as needed for nausea. 10/26/12   Christian LucyJoshua Landau, MD  sennosides-docusate sodium (SENOKOT-S) 8.6-50 MG tablet Take 2 tablets by mouth daily. 10/26/12   Christian LucyJoshua Landau, MD   Triage vitals: BP 137/91 mmHg  Pulse 42  Temp(Src) 98.1 F (36.7 C) (Oral)  Resp 16  Ht 5\' 10"  (1.778 m)  Wt 187 lb (84.823 kg)  BMI 26.83 kg/m2  SpO2 97% Physical Exam  Constitutional: He is oriented to person, place, and  time. He appears well-developed and well-nourished. No distress.  HENT:  Head: Normocephalic and atraumatic.  Eyes: Conjunctivae are normal. Pupils are equal, round, and reactive to light.  Neck: Neck supple. No tracheal deviation present. No thyromegaly present.  Cardiovascular: Regular rhythm.   No murmur heard. Bradycardic  Pulmonary/Chest: Effort normal and breath sounds normal.  Abdominal: Soft. Bowel sounds are normal. He exhibits no distension.  There is no tenderness.  Musculoskeletal: Normal range of motion. He exhibits no edema or tenderness.  Neurological: He is alert and oriented to person, place, and time. No cranial nerve deficit. He exhibits abnormal muscle tone. Coordination normal.  Gait normal not lightheaded on standing pronator drift normal Romberg normal finger to nose normal  Skin: Skin is warm and dry. No rash noted.  Psychiatric: He has a normal mood and affect.  Nursing note and vitals reviewed.   ED Course  Procedures  DIAGNOSTIC STUDIES: Oxygen Saturation is 97% on RA, Normal by my interpretation.  COORDINATION OF CARE:  8:46 PM Will order lab work and CBC. Discussed treatment plan with pt at bedside and pt agreed to plan.  inc Labs Review Labs Reviewed  CBG MONITORING, ED - Abnormal; Notable for the following:    Glucose-Capillary 105 (*)    All other components within normal limits    Imaging Review No results found. I have personally reviewed and evaluated these images and lab results as part of my medical decision-making.   EKG Interpretation   Date/Time:  Tuesday August 18 2015 19:51:01 EDT Ventricular Rate:  45 PR Interval:  134 QRS Duration: 104 QT Interval:  478 QTC Calculation: 413 R Axis:   20 Text Interpretation:  Sinus bradycardia T wave abnormality, consider  inferior ischemia Abnormal ECG No old tracing to compare Confirmed by  Tyrik Stetzer  MD, Rahima Fleishman [phone]) on 08/18/2015 7:55:47 PM     9:40 PM patient is resting comfortably and appears in no distress. He is alert Glasgow Coma Score 15. Symptoms are unchanged. Results for orders placed or performed during the hospital encounter of 08/18/15  Comprehensive metabolic panel  Result Value Ref Range   Sodium 138 135 - 145 mmol/L   Potassium 3.5 3.5 - 5.1 mmol/L   Chloride 103 101 - 111 mmol/L   CO2 27 22 - 32 mmol/L   Glucose, Bld 98 65 - 99 mg/dL   BUN 18 6 - 20 mg/dL   Creatinine, Ser 6.04 0.61 - 1.24 mg/dL   Calcium 9.4 8.9 - 54.0  mg/dL   Total Protein 7.4 6.5 - 8.1 g/dL   Albumin 4.4 3.5 - 5.0 g/dL   AST 24 15 - 41 U/L   ALT 35 17 - 63 U/L   Alkaline Phosphatase 83 38 - 126 U/L   Total Bilirubin 0.4 0.3 - 1.2 mg/dL   GFR calc non Af Amer >60 >60 mL/min   GFR calc Af Amer >60 >60 mL/min   Anion gap 8 5 - 15  CBC with Differential/Platelet  Result Value Ref Range   WBC 8.7 4.0 - 10.5 K/uL   RBC 4.76 4.22 - 5.81 MIL/uL   Hemoglobin 14.5 13.0 - 17.0 g/dL   HCT 98.1 19.1 - 47.8 %   MCV 88.9 78.0 - 100.0 fL   MCH 30.5 26.0 - 34.0 pg   MCHC 34.3 30.0 - 36.0 g/dL   RDW 29.5 62.1 - 30.8 %   Platelets 236 150 - 400 K/uL   Neutrophils Relative % 50 %   Neutro Abs  4.3 1.7 - 7.7 K/uL   Lymphocytes Relative 40 %   Lymphs Abs 3.5 0.7 - 4.0 K/uL   Monocytes Relative 9 %   Monocytes Absolute 0.8 0.1 - 1.0 K/uL   Eosinophils Relative 1 %   Eosinophils Absolute 0.1 0.0 - 0.7 K/uL   Basophils Relative 0 %   Basophils Absolute 0.0 0.0 - 0.1 K/uL  POC CBG, ED  Result Value Ref Range   Glucose-Capillary 105 (H) 65 - 99 mg/dL   No results found.  MDM  Patient has normal exam except for bradycardia which he reports is chronic. He is athletic. Normal blood pressure. I don't feel that his bradycardia is contributing to his symptoms. Plan follow-up with primary care physician it if not improving in one or 2 days. Diagnoses generalized weakness Final diagnoses:  None     I personally performed the services described in this documentation, which was scribed in my presence. The recorded information has been reviewed and considered.    Doug Sou, MD 08/18/15 2144

## 2015-08-18 NOTE — Discharge Instructions (Signed)
Weakness Lab work was normal tonight. [facility] if you don't improve within the next 2 or 3 days. Return if concerned for any reason. Weakness is a lack of strength. You may feel weak all over your body or just in one part of your body. Weakness can be serious. In some cases, you may need more medical tests. HOME CARE  Rest.  Eat a well-balanced diet.  Try to exercise every day.  Only take medicines as told by your doctor. GET HELP RIGHT AWAY IF:   You cannot do your normal daily activities.  You cannot walk up and down stairs, or you feel very tired when you do so.  You have shortness of breath or chest pain.  You have trouble moving parts of your body.  You have weakness in only one body part or on only one side of the body.  You have a fever.  You have trouble speaking or swallowing.  You cannot control when you pee (urinate) or poop (bowel movement).  You have black or bloody throw up (vomit) or poop.  Your weakness gets worse or spreads to other body parts.  You have new aches or pains. MAKE SURE YOU:   Understand these instructions.  Will watch your condition.  Will get help right away if you are not doing well or get worse.   This information is not intended to replace advice given to you by your health care provider. Make sure you discuss any questions you have with your health care provider.   Document Released: 02/11/2008 Document Revised: 08/30/2011 Document Reviewed: 04/29/2011 Elsevier Interactive Patient Education [employer]2016 Elsevier Inc.

## 2016-10-25 ENCOUNTER — Ambulatory Visit (INDEPENDENT_AMBULATORY_CARE_PROVIDER_SITE_OTHER): Payer: BC Managed Care – PPO | Admitting: Podiatry

## 2016-10-25 ENCOUNTER — Other Ambulatory Visit: Payer: Self-pay | Admitting: Podiatry

## 2016-10-25 ENCOUNTER — Encounter: Payer: Self-pay | Admitting: Podiatry

## 2016-10-25 ENCOUNTER — Ambulatory Visit: Payer: Self-pay | Admitting: Podiatry

## 2016-10-25 DIAGNOSIS — B351 Tinea unguium: Secondary | ICD-10-CM | POA: Diagnosis not present

## 2016-10-25 DIAGNOSIS — Z79899 Other long term (current) drug therapy: Secondary | ICD-10-CM

## 2016-10-25 DIAGNOSIS — L603 Nail dystrophy: Secondary | ICD-10-CM | POA: Diagnosis not present

## 2016-10-25 NOTE — Progress Notes (Signed)
   Subjective:    Patient ID: Montine CircleChris Cabal, male    DOB: 01/29/1976, 41 y.o.   MRN: [id number]  HPI  Mr.Burdine presents the office  For concerns his left big toenail becoming thick and discolored. He states that he is a runner and hiker and the nail has come off several of times and that he notices bleeding under the nail an he will get the blood out and the nail will then fall off. During the last time of it bleeding, the nail did not come off. He states he is not concerned about how the nail looks but he would like to go ahead and treat the fungus that is on the nail before it stats to spread to the other nails. Currently denies any pain to the nail and denies any surrounding redness or drainage.    Review of Systems  All other systems reviewed and are negative.      Objective:   Physical Exam General: AAO x3, NAD  Dermatological: the left hallux toenail is hypertrophic, dystrophic with subungual debris present. There is yellow to brown discoloration with a small amount of dried blood. No nail present in the right third toe. There is no surrounding redness or drainage from the nail sites. No open lesions.   Vascular: Dorsalis Pedis artery and Posterior Tibial artery pedal pulses are 2/4 bilateral with immedate capillary fill time. PThere is no pain with calf compression, swelling, warmth, erythema.   Neruologic: Grossly intact via light touch bilateral.  Protective threshold with Semmes Wienstein monofilament intact to all pedal sites bilateral.   Musculoskeletal: No gross boney pedal deformities bilateral. No pain, crepitus, or limitation noted with foot and ankle range of motion bilateral. Muscular strength 5/5 in all groups tested bilateral.  Gait: Unassisted, Nonantalgic.     Assessment & Plan:  41 year old male with left hallux onychodystrophy, likely onychomycosis -Treatment options discussed including all alternatives, risks, and complications -Etiology of symptoms were  discussed -He states he is not concerned about the appearance to the nail but would like to treat the fungus. I debrided the nail today and [facility] of onychomycosis/dysrophy. Discussed the appearance of the nail is likely a combination of both damage and fungus. I went ahead and did blood work today in anticipation for treatment. Discussed options and he will likely do oral. Discussed side affects of anti-fungal medication.   Ovid CurdMatthew Wagoner, DPM

## 2016-10-25 NOTE — Patient Instructions (Signed)

## 2016-10-26 LAB — HEPATIC FUNCTION PANEL

## 2016-10-26 LAB — CBC WITH DIFFERENTIAL/PLATELET
Basophils Absolute: 0 10*3/uL (ref 0.0–0.2)
Basos: 0 %
EOS (ABSOLUTE): 0 10*3/uL (ref 0.0–0.4)
EOS: 0 %
HEMATOCRIT: 43.1 % (ref 37.5–51.0)
HEMOGLOBIN: 14.6 g/dL (ref 13.0–17.7)
IMMATURE GRANS (ABS): 0 10*3/uL (ref 0.0–0.1)
Immature Granulocytes: 0 %
LYMPHS ABS: 2.1 10*3/uL (ref 0.7–3.1)
LYMPHS: 25 %
MCH: 29.8 pg (ref 26.6–33.0)
MCHC: 33.9 g/dL (ref 31.5–35.7)
MCV: 88 fL (ref 79–97)
Monocytes Absolute: 0.7 10*3/uL (ref 0.1–0.9)
Monocytes: 8 %
NEUTROS ABS: 5.7 10*3/uL (ref 1.4–7.0)
Neutrophils: 67 %
Platelets: 241 10*3/uL (ref 150–379)
RBC: 4.9 x10E6/uL (ref 4.14–5.80)
RDW: 14.5 % (ref 12.3–15.4)
WBC: 8.5 10*3/uL (ref 3.4–10.8)

## 2016-11-01 ENCOUNTER — Encounter: Payer: Self-pay | Admitting: Podiatry

## 2016-11-02 ENCOUNTER — Telehealth: Payer: Self-pay | Admitting: *Deleted

## 2016-11-02 DIAGNOSIS — Z01812 Encounter for preprocedural laboratory examination: Secondary | ICD-10-CM

## 2016-11-02 NOTE — Telephone Encounter (Addendum)
Called [facility] for results of Hepatic function 10/25/2016. [name] - [facility] states will fax the Hepatic function.11/03/2016-Christian Horne - [employer] states she will fax to our office in the next 5 minutes.11/04/2016-DrArdelle Anton ordered Lamisil 250mg  #60 one tablet daily, must repeat labs in 4-6 weeks. Emailed instructions and confirmation rx was called to the [facility]. Mailed labs to pt as reminder.11/10/2016-Pt emailed symptoms that began after taking Terbinafine. Dr. Ardelle Anton states stop the Terbinafine and ordered [facility] Onychomycosis Nail Lacquer. I emailed Dr. Gabriel Rung orders to pt and faxed orders to [facility].

## 2016-11-03 ENCOUNTER — Encounter: Payer: Self-pay | Admitting: Podiatry

## 2016-11-04 MED ORDER — TERBINAFINE HCL 250 MG PO TABS: 250.0000 mg | ORAL_TABLET | Freq: Every day | ORAL | 0 refills | Status: AC

## 2016-11-08 ENCOUNTER — Encounter: Payer: Self-pay | Admitting: Podiatry

## 2016-11-10 ENCOUNTER — Encounter: Payer: Self-pay | Admitting: Podiatry

## 2016-11-10 MED ORDER — NONFORMULARY OR COMPOUNDED ITEM: 2 refills | Status: AC

## 2017-04-17 ENCOUNTER — Ambulatory Visit (HOSPITAL_BASED_OUTPATIENT_CLINIC_OR_DEPARTMENT_OTHER)
Admission: RE | Admit: 2017-04-17 | Discharge: 2017-04-17 | Disposition: A | Discharge status: Home / Self Care | Payer: BC Managed Care – PPO | Source: Ambulatory Visit | Attending: Family Medicine | Admitting: Family Medicine

## 2017-04-17 ENCOUNTER — Ambulatory Visit: Payer: BC Managed Care – PPO | Admitting: Family Medicine

## 2017-04-17 ENCOUNTER — Encounter: Payer: Self-pay | Admitting: Family Medicine

## 2017-04-17 VITALS — BP 129/88 | HR 59 | Ht 70.0 in | Wt 187.0 lb

## 2017-04-17 DIAGNOSIS — M25572 Pain in left ankle and joints of left foot: Secondary | ICD-10-CM | POA: Insufficient documentation

## 2017-04-17 DIAGNOSIS — G8929 Other chronic pain: Secondary | ICD-10-CM | POA: Insufficient documentation

## 2017-04-17 NOTE — Patient Instructions (Signed)
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 Your x-rays and ultrasound are reassuring. Ice the area for 15 minutes at a time, 3-4 times a day Aleve 2 tabs twice a day with food OR ibuprofen 3 tabs three times a day with food for pain and inflammation only if needed. Sports insoles/arch supports are very important - wear regularly when up and on your feet. Start theraband strengthening exercises when directed - once or twice a day 3 sets of 10. Consider MRI, boot, laceup ankle brace if not improving as expected. If pain is worse than a 3 on a scale of 1-10 or you're limping when trying to run, I would stop this.  Otherwise activities as tolerated. Follow up in 6 weeks.

## 2017-04-19 ENCOUNTER — Encounter: Payer: Self-pay | Admitting: Family Medicine

## 2017-04-19 DIAGNOSIS — M25572 Pain in left ankle and joints of left foot: Secondary | ICD-10-CM | POA: Insufficient documentation

## 2017-04-19 NOTE — Progress Notes (Signed)
PCP: Garth Schlatter, MD  Subjective:   HPI: Patient is a 42 y.o. male here for left ankle pain.  Patient reports for past several months he's had lateral left ankle pain. Pain is 4-5/10 and constant, sharp. Worse with stairs and first thing in the morning. No bruising or swelling. Pain improves with running, worse with walking. Sometimes gets a click in this area. Believes he inverted this ankle prior to pain starting. No skin changes, numbness.  Past Medical History:  Diagnosis Date  . Family history of neurofibromatosis   . History of kidney stones 04/2012  . 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 methocarbamol (ROBAXIN) 500 MG tablet Take 1 tablet (500 mg total) by mouth 4 (four) times daily. (Patient not taking: Reported on 10/25/2016) 75 tablet 1  . [facility]EM [facility]:  Onychomycosis Nail Lacquer - Fluconazole 2%, Terbinafine 1%, DMSO, apply to affected area daily. 120 each 2  . oxyCODONE-acetaminophen (ROXICET) 5-325 MG per tablet Take 1-2 tablets by mouth every 6 (six) hours as needed for pain. (Patient not taking: Reported on 10/25/2016) 75 tablet 0  . promethazine (PHENERGAN) 25 MG tablet Take 1 tablet (25 mg total) by mouth every 6 (six) hours as needed for nausea. (Patient not taking: Reported on 10/25/2016) 30 tablet 0  . sennosides-docusate sodium (SENOKOT-S) 8.6-50 MG tablet Take 2 tablets by mouth daily. (Patient not taking: Reported on 10/25/2016) 30 tablet 1  . terbinafine (LAMISIL) 250 MG tablet Take 1 tablet (250 mg total) by mouth daily. 30 tablet 0   No current facility-administered medications on file prior to visit.     Past Surgical History:  Procedure Laterality Date  . KNEE ARTHROSCOPY Bilateral   . SHOULDER ARTHROSCOPY Right 10/26/2012   Procedure: RIGHT ARTHROSCOPY SHOULDER WITH EXTENSIVE DEBRIDEMENT;  Surgeon: Eulas Post, MD;  Location: [facility];  Service: Orthopedics;  Laterality: Right;  . SHOULDER ARTHROSCOPY W/ ROTATOR CUFF REPAIR Left     No Known Allergies  Social History   Socioeconomic History  . Marital status: Married    Spouse name: Not on file  . Number of children: Not on file  . Years of education: Not on file  . Highest education level: Not on file  Social Needs  . Financial resource strain: Not on file  . Food insecurity - worry: Not on file  . Food insecurity - inability: Not on file  . Transportation needs - medical: Not on file  . Transportation needs - non-medical: Not on file  Occupational History  . Not on file  Tobacco Use  . Smoking status: Former Smoker    Last attempt to quit: 03/14/2000    Years since quitting: 17.1  . Smokeless tobacco: Never Used  Substance and Sexual Activity  . Alcohol use: Yes    Comment: occasionally  . Drug use: No  . Sexual activity: Not on file  Other Topics Concern  . Not on file  Social History Narrative  . Not on file    History reviewed. No pertinent family history.  BP 129/88   Pulse (!) 59   Ht 5\' 10"  (1.778 m)   Wt 187 lb (84.8 kg)   BMI 26.83 kg/m   Review of Systems: See HPI above.     Objective:  Physical Exam:  Gen: NAD, comfortable in exam room  Left ankle: No gross  deformity, swelling, ecchymoses FROM with 5/5 strength all directions without pain. TTP over sinus tarsi.  No lateral malleolus, peroneal tendon, other tenderness. 1+ anterior drawer with pain.  Negative talar tilt. Negative syndesmotic compression. Thompsons test negative. NV intact distally.  Right ankle: No deformity. FROM with 5/5 strength. No tenderness to palpation. NVI distally.  MSK u/s Left ankle:  Distal fibula appears normal.  Mild thickening of peroneal tendons but without tenosynovitis or tendon tears.  Talus appears normal as well.  Mild increased fluid at sinus tarsi.   Assessment & Plan:  1. Left  ankle pain - independently reviewed radiographs and no evidence fracture or OCD.  Ultrasound noted mild increased fluid in area of sinus tarsi but no other abnormalities.  Consistent with ankle instability and sinus tarsi syndrome.  Pain actually improves with exercising making stress fracture unlikely.  Icing, aleve or ibuprofen.  Arch supports stressed.  Shown home rehab exercises to do daily but not to force full extent of ER.  Consider MRI, cam walker, ASO if not improving as expected.  Also reviewed relative rest.  F/u in 6 weeks.

## 2017-04-19 NOTE — Assessment & Plan Note (Signed)
independently reviewed radiographs and no evidence fracture or OCD.  Ultrasound noted mild increased fluid in area of sinus tarsi but no other abnormalities.  Consistent with ankle instability and sinus tarsi syndrome.  Pain actually improves with exercising making stress fracture unlikely.  Icing, aleve or ibuprofen.  Arch supports stressed.  Shown home rehab exercises to do daily but not to force full extent of ER.  Consider MRI, cam walker, ASO if not improving as expected.  Also reviewed relative rest.  F/u in 6 weeks.

## 2017-05-29 ENCOUNTER — Ambulatory Visit: Payer: BC Managed Care – PPO | Admitting: Family Medicine

## 2017-12-23 ENCOUNTER — Encounter (HOSPITAL_BASED_OUTPATIENT_CLINIC_OR_DEPARTMENT_OTHER): Payer: Self-pay

## 2017-12-23 ENCOUNTER — Emergency Department (HOSPITAL_BASED_OUTPATIENT_CLINIC_OR_DEPARTMENT_OTHER)
Admission: EM | Admit: 2017-12-23 | Discharge: 2017-12-24 | Disposition: A | Discharge status: Home / Self Care | Payer: BC Managed Care – PPO | Attending: Emergency Medicine | Admitting: Emergency Medicine

## 2017-12-23 ENCOUNTER — Other Ambulatory Visit: Payer: Self-pay

## 2017-12-23 ENCOUNTER — Emergency Department (HOSPITAL_BASED_OUTPATIENT_CLINIC_OR_DEPARTMENT_OTHER): Payer: BC Managed Care – PPO

## 2017-12-23 DIAGNOSIS — R51 Headache: Secondary | ICD-10-CM | POA: Insufficient documentation

## 2017-12-23 DIAGNOSIS — Z87891 Personal history of nicotine dependence: Secondary | ICD-10-CM | POA: Diagnosis not present

## 2017-12-23 DIAGNOSIS — Z79899 Other long term (current) drug therapy: Secondary | ICD-10-CM | POA: Diagnosis not present

## 2017-12-23 DIAGNOSIS — R519 Headache, unspecified: Secondary | ICD-10-CM

## 2017-12-23 DIAGNOSIS — R109 Unspecified abdominal pain: Secondary | ICD-10-CM | POA: Insufficient documentation

## 2017-12-23 NOTE — ED Triage Notes (Addendum)
Pt is being treated for migraines and low back pain since Sept. by his PCP. Today pt had a sudden increase in back pain and HA today. Pt states pain radiates down R leg.

## 2017-12-24 MED ORDER — HYDROCODONE-ACETAMINOPHEN 5-325 MG PO TABS: 1.0000 | ORAL_TABLET | Freq: Four times a day (QID) | ORAL | 0 refills | Status: DC | PRN

## 2017-12-24 MED ORDER — HYDROCODONE-ACETAMINOPHEN 5-325 MG PO TABS
1.0000 | ORAL_TABLET | Freq: Once | ORAL | Status: AC
  Administered 2017-12-24: 1 via ORAL
  Filled 2017-12-24: qty 1

## 2017-12-24 NOTE — Discharge Instructions (Addendum)
Lab results from here tonight attached to your discharge instructions.  Take the hydrocodone as needed for the pain.  Follow-up with your primary care doctor as well as neurology.  I will highly recommend that she be screened for Lyme's disease.  That may have already been done by a primary care doctor.  Return for any new or worse symptoms.  CT scan of the abdomen showed no acute findings here today no evidence of any ureteral stones.  You do have multiple bilateral kidney stones but this should not be causing the symptoms.

## 2017-12-24 NOTE — ED Provider Notes (Signed)
[facility] EMERGENCY DEPARTMENT Provider Note   CSN: [id number] Arrival date & time: 12/23/17  1728     History   Chief Complaint Chief Complaint  Patient presents with  . Back Pain  . Headache    HPI Christian Horne is a 42 y.o. male.  Patient with new onset and being treated by his primary care doctor for migraines and low back pain since September.  Today patient had a sudden increase in back pain but patient really points more to his left flank.  And there is some radiation into the left leg.  As well.  No significant numbness or weakness to the left foot or the right foot.  No history of migraines.  Patient with is being referred to neurology.  Patient had CT scan of his head on October 3 that was negative.  Patient without fevers.  Patient's headache is somewhat all over.  But a little more pronounced in the posterior part of the head.  Patient states his primary care doctor is working on trying to figure out what is ongoing.  He is a little concerned that may be has kidney stones since the pain in his left flank.  Has not been evaluated for that.  In addition patient feels as if that left stuff flank pain is radiating into the left lower quadrant part of the abdomen.     Past Medical History:  Diagnosis Date  . Family history of neurofibromatosis   . History of kidney stones 04/2012  . 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 Left ankle pain 04/19/2017    Past Surgical History:  Procedure Laterality Date  . KNEE ARTHROSCOPY Bilateral   . SHOULDER ARTHROSCOPY Right 10/26/2012   Procedure: RIGHT ARTHROSCOPY SHOULDER WITH EXTENSIVE DEBRIDEMENT;  Surgeon: Eulas Post, MD;  Location: [facility];  Service: Orthopedics;  Laterality: Right;  . SHOULDER ARTHROSCOPY W/ ROTATOR CUFF REPAIR Left   . TONSILLECTOMY          Home Medications    Prior to Admission medications   Medication  Sig Start Date End Date Taking? Authorizing Provider  HYDROcodone-acetaminophen (NORCO/VICODIN) 5-325 MG tablet Take 1 tablet by mouth every 6 (six) hours as needed for moderate pain. 12/24/17   Vanetta Mulders, MD  methocarbamol (ROBAXIN) 500 MG tablet Take 1 tablet (500 mg total) by mouth 4 (four) times daily. Patient not taking: Reported on 10/25/2016 10/26/12   Teryl Lucy, MD  NONFORMULARY OR COMPOUNDED ITEM [facility]:  Onychomycosis Nail Lacquer - Fluconazole 2%, Terbinafine 1%, DMSO, apply to affected area daily. 11/10/16   Vivi Barrack, DPM  oxyCODONE-acetaminophen (ROXICET) 5-325 MG per tablet Take 1-2 tablets by mouth every 6 (six) hours as needed for pain. Patient not taking: Reported on 10/25/2016 10/26/12   Teryl Lucy, MD  promethazine (PHENERGAN) 25 MG tablet Take 1 tablet (25 mg total) by mouth every 6 (six) hours as needed for nausea. Patient not taking: Reported on 10/25/2016 10/26/12   Teryl Lucy, MD  sennosides-docusate sodium (SENOKOT-S) 8.6-50 MG tablet Take 2 tablets by mouth daily. Patient not taking: Reported on 10/25/2016 10/26/12   Teryl Lucy, MD  terbinafine (LAMISIL) 250 MG tablet Take 1 tablet (250 mg total) by mouth daily. 11/04/16   Vivi Barrack, DPM    Family History No family history on file.  Social History Social History   Tobacco Use  . Smoking status: Former  Smoker    Last attempt to quit: 03/14/2000    Years since quitting: 17.7  . Smokeless tobacco: Never Used  Substance Use Topics  . Alcohol use: Not Currently    Comment: occasionally  . Drug use: No     Allergies   Patient has no known allergies.   Review of Systems Review of Systems  Constitutional: Negative for fever.  HENT: Negative for congestion.   Eyes: Negative for visual disturbance.  Respiratory: Negative for shortness of breath.   Cardiovascular: Negative for chest pain.  Gastrointestinal: Negative for abdominal pain.  Genitourinary: Positive for  flank pain. Negative for dysuria and hematuria.  Musculoskeletal: Positive for back pain. Negative for neck stiffness.  Skin: Negative for rash.  Neurological: Negative for weakness and numbness.  Hematological: Does not bruise/bleed easily.  Psychiatric/Behavioral: Negative for confusion.     Physical Exam Updated Vital Signs BP (!) 138/97 (BP Location: Right Arm)   Pulse (!) 38   Temp (!) 97.5 F (36.4 C) (Oral)   Resp 16   Ht 1.778 m (5\' 10" )   Wt 85.3 kg   SpO2 95%   BMI 26.98 kg/m   Physical Exam  Constitutional: He is oriented to person, place, and time. He appears well-developed and well-nourished. No distress.  HENT:  Head: Normocephalic and atraumatic.  Mouth/Throat: Oropharynx is clear and moist.  Eyes: Pupils are equal, round, and reactive to light. Conjunctivae and EOM are normal.  Neck: Normal range of motion. Neck supple.  Cardiovascular: Normal rate, regular rhythm and normal heart sounds.  No murmur heard. Pulmonary/Chest: Effort normal and breath sounds normal. No respiratory distress.  Abdominal: Soft. Bowel sounds are normal. There is no tenderness.  Musculoskeletal: Normal range of motion. He exhibits no edema.  No real tenderness to palpation to the left back area left flank area.  Neurological: He is alert and oriented to person, place, and time. No cranial nerve deficit or sensory deficit. He exhibits normal muscle tone. Coordination normal.  Skin: Skin is warm. Capillary refill takes less than 2 seconds. No rash noted.  Nursing note and vitals reviewed.    ED Treatments / Results  Labs (all labs ordered are listed, but only abnormal results are displayed) Labs Reviewed - No data to display  EKG None  Radiology Ct Renal Stone Study  Result Date: 12/24/2017 CLINICAL DATA:  Back pain radiating to lower extremities. History of kidney stones. EXAM: CT ABDOMEN AND PELVIS WITHOUT CONTRAST TECHNIQUE: Multidetector CT imaging of the abdomen and  pelvis was performed following the standard protocol without IV contrast. COMPARISON:  None. FINDINGS: LOWER CHEST: There is no basilar pleural or apical pericardial effusion. HEPATOBILIARY: The hepatic contours and density are normal. There is no intra- or extrahepatic biliary dilatation. The gallbladder is normal. PANCREAS: The pancreatic parenchymal contours are normal and there is no ductal dilatation. There is no peripancreatic fluid collection. SPLEEN: Normal. ADRENALS/URINARY TRACT: --Adrenal glands: Normal. --Right kidney/ureter: Multiple nonobstructing renal calculi, measuring up to 2 mm. No hydronephrosis, perinephric stranding or solid renal mass. --Left kidney/ureter: Multiple nonobstructing renal calculi, measuring up to 4 mm. No hydronephrosis, perinephric stranding or solid renal mass. --Urinary bladder: Normal for degree of distention STOMACH/BOWEL: --Stomach/Duodenum: There is no hiatal hernia or other gastric abnormality. The duodenal course and caliber are normal. --Small bowel: No dilatation or inflammation. --Colon: No focal abnormality. --Appendix: Normal. VASCULAR/LYMPHATIC: Normal course and caliber of the major abdominal vessels. No abdominal or pelvic lymphadenopathy. REPRODUCTIVE: There are calcifications within the normal-sized  prostate. Symmetric seminal vesicles. MUSCULOSKELETAL. No bony spinal canal stenosis or focal osseous abnormality. OTHER: None. IMPRESSION: Bilateral nephrolithiasis, measuring up to 4 mm, without obstructive uropathy. No acute abnormality of the abdomen or pelvis. Electronically Signed   By: Deatra Robinson M.D.   On: 12/24/2017 00:20    Procedures Procedures (including critical care time)  Medications Ordered in ED Medications  HYDROcodone-acetaminophen (NORCO/VICODIN) 5-325 MG per tablet 1 tablet (has no administration in time range)     Initial Impression / Assessment and Plan / ED Course  I have reviewed the triage vital signs and the nursing  notes.  Pertinent labs & imaging results that were available during my care of the patient were reviewed by me and considered in my medical decision making (see chart for details).     Patient's work-up here does show evidence of bilateral stones in the kidney with no evidence of any ureteral stones.  Do not feel that this is the cause of his pain.  Patient was sort of chronic headaches that are intractable.  Patient discharged with a short course of hydrocodone.  He will follow back up with his primary care doctors and neurology.  Patient did not want any labs checked.  Patient nontoxic stable for discharge home.  Final Clinical Impressions(s) / ED Diagnoses   Final diagnoses:  Left flank pain  Chronic intractable headache, unspecified headache type    ED Discharge Orders         Ordered    HYDROcodone-acetaminophen (NORCO/VICODIN) 5-325 MG tablet  Every 6 hours PRN     12/24/17 0054           Vanetta Mulders, MD 01/07/18 1736

## 2017-12-24 NOTE — ED Notes (Signed)
Pt understood dc material. NAD noted. Script given at dc  

## 2018-05-31 ENCOUNTER — Other Ambulatory Visit: Payer: Self-pay

## 2018-05-31 ENCOUNTER — Encounter (HOSPITAL_BASED_OUTPATIENT_CLINIC_OR_DEPARTMENT_OTHER): Payer: Self-pay | Admitting: *Deleted

## 2018-05-31 ENCOUNTER — Emergency Department (HOSPITAL_BASED_OUTPATIENT_CLINIC_OR_DEPARTMENT_OTHER): Payer: BC Managed Care – PPO

## 2018-05-31 ENCOUNTER — Emergency Department (HOSPITAL_BASED_OUTPATIENT_CLINIC_OR_DEPARTMENT_OTHER)
Admission: EM | Admit: 2018-05-31 | Discharge: 2018-05-31 | Disposition: A | Discharge status: Home / Self Care | Payer: BC Managed Care – PPO | Attending: Emergency Medicine | Admitting: Emergency Medicine

## 2018-05-31 DIAGNOSIS — Z87891 Personal history of nicotine dependence: Secondary | ICD-10-CM | POA: Diagnosis not present

## 2018-05-31 DIAGNOSIS — Z79899 Other long term (current) drug therapy: Secondary | ICD-10-CM | POA: Diagnosis not present

## 2018-05-31 DIAGNOSIS — R1031 Right lower quadrant pain: Secondary | ICD-10-CM | POA: Insufficient documentation

## 2018-05-31 LAB — COMPREHENSIVE METABOLIC PANEL
ALK PHOS: 84 U/L (ref 38–126)
ALT: 30 U/L (ref 0–44)
AST: 23 U/L (ref 15–41)
Albumin: 4.3 g/dL (ref 3.5–5.0)
Anion gap: 7 (ref 5–15)
BUN: 18 mg/dL (ref 6–20)
CALCIUM: 9.2 mg/dL (ref 8.9–10.3)
CO2: 23 mmol/L (ref 22–32)
CREATININE: 0.95 mg/dL (ref 0.61–1.24)
Chloride: 108 mmol/L (ref 98–111)
GFR calc Af Amer: >60 mL/min (ref >60)
Glucose, Bld: 99 mg/dL (ref 70–99)
Potassium: 3.5 mmol/L (ref 3.5–5.1)
SODIUM: 138 mmol/L (ref 135–145)
Total Bilirubin: 0.5 mg/dL (ref 0.3–1.2)
Total Protein: 7.3 g/dL (ref 6.5–8.1)

## 2018-05-31 LAB — CBC WITH DIFFERENTIAL/PLATELET
ABS IMMATURE GRANULOCYTES: 0.02 10*3/uL (ref 0.00–0.07)
BASOS ABS: 0 10*3/uL (ref 0.0–0.1)
Basophils Relative: 1 %
Eosinophils Absolute: 0.1 10*3/uL (ref 0.0–0.5)
Eosinophils Relative: 2 %
HCT: 42 % (ref 39.0–52.0)
Hemoglobin: 13.9 g/dL (ref 13.0–17.0)
IMMATURE GRANULOCYTES: 0 %
LYMPHS ABS: 2.5 10*3/uL (ref 0.7–4.0)
Lymphocytes Relative: 38 %
MCH: 30.2 pg (ref 26.0–34.0)
MCHC: 33.1 g/dL (ref 30.0–36.0)
MCV: 91.3 fL (ref 80.0–100.0)
MONO ABS: 0.8 10*3/uL (ref 0.1–1.0)
Monocytes Relative: 11 %
Neutro Abs: 3.2 10*3/uL (ref 1.7–7.7)
Neutrophils Relative %: 48 %
Platelets: 207 10*3/uL (ref 150–400)
RBC: 4.6 MIL/uL (ref 4.22–5.81)
RDW: 12.7 % (ref 11.5–15.5)
WBC: 6.7 10*3/uL (ref 4.0–10.5)
nRBC: 0 % (ref 0.0–0.2)

## 2018-05-31 LAB — URINALYSIS, ROUTINE W REFLEX MICROSCOPIC
Bilirubin Urine: NEGATIVE
GLUCOSE, UA: NEGATIVE mg/dL
Hgb urine dipstick: NEGATIVE
KETONES UR: NEGATIVE mg/dL
LEUKOCYTE UA: NEGATIVE
NITRITE: NEGATIVE
Protein, ur: NEGATIVE mg/dL
Specific Gravity, Urine: 1.025 (ref 1.005–1.030)
pH: 6 (ref 5.0–8.0)

## 2018-05-31 MED ORDER — IOHEXOL 300 MG/ML  SOLN
100.0000 mL | Freq: Once | INTRAMUSCULAR | Status: AC | PRN
  Administered 2018-05-31: 100 mL via INTRAVENOUS

## 2018-05-31 NOTE — ED Triage Notes (Addendum)
Abdominal pain x 2 weeks. He has an appointment for an U/S tomorrow but the pain has gotten worse today. Pain is isolated to his right quadrant. Pt states he went through extensive testing for same pain a few months ago and all the test were negative.

## 2018-05-31 NOTE — ED Provider Notes (Signed)
[facility] EMERGENCY DEPARTMENT Provider Note   CSN: [id number] Arrival date & time: 05/31/18  1932    History   Chief Complaint Chief Complaint  Patient presents with  . Abdominal Pain    HPI Christian Horne is a 43 y.o. male.     Patient is a 43 year old male who presents with lower abdominal pain.  He states that he has had pain in his right lower abdomen for about the last 3 days.  He previously was having some pain in his left flank but that is been more of an ongoing problem it does not seem to be related to his right lower quadrant pain.  He says it started more around his bellybutton but now has moved to his right lower abdomen.  Today he felt a little lightheaded and nauseated.  He also has no appetite.  He denies any previous abdominal surgeries.  No change in bowel habits.  No fever or chills.  No URI symptoms.  No hematuria or other urinary symptoms.  He has not taken anything at home for the pain.     Past Medical History:  Diagnosis Date  . Family history of neurofibromatosis   . History of kidney stones 04/2012  . 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 Left ankle pain 04/19/2017    Past Surgical History:  Procedure Laterality Date  . KNEE ARTHROSCOPY Bilateral   . SHOULDER ARTHROSCOPY Right 10/26/2012   Procedure: RIGHT ARTHROSCOPY SHOULDER WITH EXTENSIVE DEBRIDEMENT;  Surgeon: Eulas Post, MD;  Location: [facility];  Service: Orthopedics;  Laterality: Right;  . SHOULDER ARTHROSCOPY W/ ROTATOR CUFF REPAIR Left   . TONSILLECTOMY          Home Medications    Prior to Admission medications   Medication Sig Start Date End Date Taking? Authorizing Provider  HYDROcodone-acetaminophen (NORCO/VICODIN) 5-325 MG tablet Take 1 tablet by mouth every 6 (six) hours as needed for moderate pain. 12/24/17   Vanetta Mulders, MD  methocarbamol (ROBAXIN) 500 MG tablet Take 1  tablet (500 mg total) by mouth 4 (four) times daily. Patient not taking: Reported on 10/25/2016 10/26/12   Teryl Lucy, MD  [facility]FORMULARY [facility]OUNDED [facility]EM [facility]:  Onychomycosis Nail Lacquer - Fluconazole 2%, Terbinafine 1%, DMSO, apply to affected area daily. 11/10/16   Vivi Barrack, DPM  oxyCODONE-acetaminophen (ROXICET) 5-325 MG per tablet Take 1-2 tablets by mouth every 6 (six) hours as needed for pain. Patient not taking: Reported on 10/25/2016 10/26/12   Teryl Lucy, MD  promethazine (PHENERGAN) 25 MG tablet Take 1 tablet (25 mg total) by mouth every 6 (six) hours as needed for nausea. Patient not taking: Reported on 10/25/2016 10/26/12   Teryl Lucy, MD  sennosides-docusate sodium (SENOKOT-S) 8.6-50 MG tablet Take 2 tablets by mouth daily. Patient not taking: Reported on 10/25/2016 10/26/12   Teryl Lucy, MD  terbinafine (LAMISIL) 250 MG tablet Take 1 tablet (250 mg total) by mouth daily. 11/04/16   Vivi Barrack, DPM    Family History No family history on file.  Social History Social History   Tobacco Use  . Smoking status: Former Smoker    Last attempt to quit: 03/14/2000    Years since quitting: 18.2  . Smokeless tobacco: Never Used  Substance Use Topics  . Alcohol use: Not Currently    Comment: occasionally  . Drug use: No     Allergies  Terbinafine   Review of Systems Review of Systems  Constitutional: Positive for appetite change. Negative for chills, diaphoresis, fatigue and fever.  HENT: Negative for congestion, rhinorrhea and sneezing.   Eyes: Negative.   Respiratory: Negative for cough, chest tightness and shortness of breath.   Cardiovascular: Negative for chest pain and leg swelling.  Gastrointestinal: Positive for abdominal pain and nausea. Negative for blood in stool, diarrhea and vomiting.  Genitourinary: Negative for difficulty urinating, flank pain, frequency and hematuria.  Musculoskeletal: Negative for arthralgias and back  pain.  Skin: Negative for rash.  Neurological: Positive for light-headedness. Negative for dizziness, speech difficulty, weakness, numbness and headaches.     Physical Exam Updated Vital Signs BP (!) 128/94 (BP Location: Left Arm)   Pulse (!) 55   Temp 98.3 F (36.8 C) (Oral)   Resp 16   Ht 5\' 10"  (1.778 m)   Wt 87.5 kg   SpO2 97%   BMI 27.69 kg/m   Physical Exam Constitutional:      Appearance: He is well-developed.  HENT:     Head: Normocephalic and atraumatic.  Eyes:     Pupils: Pupils are equal, round, and reactive to light.  Neck:     Musculoskeletal: Normal range of motion and neck supple.  Cardiovascular:     Rate and Rhythm: Normal rate and regular rhythm.     Heart sounds: Normal heart sounds.  Pulmonary:     Effort: Pulmonary effort is normal. No respiratory distress.     Breath sounds: Normal breath sounds. No wheezing or rales.  Chest:     Chest wall: No tenderness.  Abdominal:     General: Bowel sounds are normal.     Palpations: Abdomen is soft.     Tenderness: There is abdominal tenderness in the right lower quadrant. There is no guarding or rebound.  Genitourinary:    Comments: No pain in the inguinal canal.  No pain in the testicles. Musculoskeletal: Normal range of motion.  Lymphadenopathy:     Cervical: No cervical adenopathy.  Skin:    General: Skin is warm and dry.     Findings: No rash.  Neurological:     Mental Status: He is alert and oriented to person, place, and time.      ED Treatments / Results  Labs (all labs ordered are listed, but only abnormal results are displayed) Labs Reviewed  COMPREHENSIVE METABOLIC PANEL  CBC WITH DIFFERENTIAL/PLATELET  URINALYSIS, ROUTINE W REFLEX MICROSCOPIC    EKG None  Radiology Ct Abdomen Pelvis W Contrast  Result Date: 05/31/2018 CLINICAL DATA:  Right lower quadrant pain EXAM: CT ABDOMEN AND PELVIS WITH CONTRAST TECHNIQUE: Multidetector CT imaging of the abdomen and pelvis was performed  using the standard protocol following bolus administration of intravenous contrast. CONTRAST:  100mL OMNIPAQUE IOHEXOL 300 MG/ML  SOLN COMPARISON:  12/23/2017 FINDINGS: Lower chest: Lung bases demonstrate no acute consolidation or effusion. Mild cardiomegaly. Hepatobiliary: No focal liver abnormality is seen. No gallstones, gallbladder wall thickening, or biliary dilatation. Pancreas: Unremarkable. No pancreatic ductal dilatation or surrounding inflammatory changes. Spleen: Normal in size without focal abnormality. Adrenals/Urinary Tract: Adrenal glands are normal. No hydronephrosis. Intrarenal stones bilaterally. Bladder is normal Stomach/Bowel: Stomach is within normal limits. Appendix appears normal. No evidence of bowel wall thickening, distention, or inflammatory changes. Vascular/Lymphatic: No significant vascular findings are present. No enlarged abdominal or pelvic lymph nodes. Reproductive: Prostate is unremarkable. Other: Negative for free air or free fluid. Musculoskeletal: No acute or significant osseous findings. IMPRESSION:  1. No CT evidence for acute intra-abdominal or pelvic abnormality. 2. Nonobstructing kidney stones Electronically Signed   By: Jasmine Pang M.D.   On: 05/31/2018 23:05    Procedures Procedures (including critical care time)  Medications Ordered in ED Medications  iohexol (OMNIPAQUE) 300 MG/ML solution 100 mL (100 mLs Intravenous Contrast Given 05/31/18 2235)     Initial Impression / Assessment and Plan / ED Course  I have reviewed the triage vital signs and the nursing notes.  Pertinent labs & imaging results that were available during my care of the patient were reviewed by me and considered in my medical decision making (see chart for details).        CT scan shows no evidence of acute abnormality.  His labs are non-concerning.  He has no evidence of gallbladder disease.  His appendix looks normal.  There is no evidence of UTI or kidney stone.  His pain is  well controlled and his abdominal exam is benign.  He was discharged home in good condition.  He was given symptomatic care instructions.  He was encouraged to follow-up with his PCP for recheck.  Return precautions were given.  Final Clinical Impressions(s) / ED Diagnoses   Final diagnoses:  Right lower quadrant abdominal pain    ED Discharge Orders    None       Rolan Bucco, MD 05/31/18 2316

## 2018-06-04 ENCOUNTER — Other Ambulatory Visit: Payer: Self-pay

## 2018-06-04 ENCOUNTER — Emergency Department (HOSPITAL_BASED_OUTPATIENT_CLINIC_OR_DEPARTMENT_OTHER)
Admission: EM | Admit: 2018-06-04 | Discharge: 2018-06-05 | Disposition: A | Discharge status: Home / Self Care | Payer: BC Managed Care – PPO | Attending: Emergency Medicine | Admitting: Emergency Medicine

## 2018-06-04 ENCOUNTER — Emergency Department (HOSPITAL_BASED_OUTPATIENT_CLINIC_OR_DEPARTMENT_OTHER): Payer: BC Managed Care – PPO

## 2018-06-04 ENCOUNTER — Encounter (HOSPITAL_BASED_OUTPATIENT_CLINIC_OR_DEPARTMENT_OTHER): Payer: Self-pay | Admitting: *Deleted

## 2018-06-04 DIAGNOSIS — Z87891 Personal history of nicotine dependence: Secondary | ICD-10-CM | POA: Diagnosis not present

## 2018-06-04 DIAGNOSIS — R42 Dizziness and giddiness: Secondary | ICD-10-CM | POA: Diagnosis not present

## 2018-06-04 DIAGNOSIS — I493 Ventricular premature depolarization: Secondary | ICD-10-CM | POA: Diagnosis not present

## 2018-06-04 DIAGNOSIS — R410 Disorientation, unspecified: Secondary | ICD-10-CM | POA: Insufficient documentation

## 2018-06-04 DIAGNOSIS — R202 Paresthesia of skin: Secondary | ICD-10-CM | POA: Insufficient documentation

## 2018-06-04 DIAGNOSIS — R531 Weakness: Secondary | ICD-10-CM | POA: Diagnosis present

## 2018-06-04 DIAGNOSIS — R110 Nausea: Secondary | ICD-10-CM | POA: Diagnosis not present

## 2018-06-04 LAB — CBC WITH DIFFERENTIAL/PLATELET
ABS IMMATURE GRANULOCYTES: 0.03 10*3/uL (ref 0.00–0.07)
BASOS ABS: 0 10*3/uL (ref 0.0–0.1)
Basophils Relative: 0 %
Eosinophils Absolute: 0.1 10*3/uL (ref 0.0–0.5)
Eosinophils Relative: 1 %
HEMATOCRIT: 43.1 % (ref 39.0–52.0)
Hemoglobin: 14.2 g/dL (ref 13.0–17.0)
Immature Granulocytes: 0 %
LYMPHS ABS: 2.3 10*3/uL (ref 0.7–4.0)
LYMPHS PCT: 26 %
MCH: 30 pg (ref 26.0–34.0)
MCHC: 32.9 g/dL (ref 30.0–36.0)
MCV: 90.9 fL (ref 80.0–100.0)
Monocytes Absolute: 0.6 10*3/uL (ref 0.1–1.0)
Monocytes Relative: 7 %
NEUTROS ABS: 5.9 10*3/uL (ref 1.7–7.7)
Neutrophils Relative %: 66 %
Platelets: 230 10*3/uL (ref 150–400)
RBC: 4.74 MIL/uL (ref 4.22–5.81)
RDW: 12.8 % (ref 11.5–15.5)
WBC: 8.9 10*3/uL (ref 4.0–10.5)
nRBC: 0 % (ref 0.0–0.2)

## 2018-06-04 LAB — URINALYSIS, MICROSCOPIC (REFLEX)

## 2018-06-04 LAB — URINALYSIS, ROUTINE W REFLEX MICROSCOPIC
BILIRUBIN URINE: NEGATIVE
Glucose, UA: NEGATIVE mg/dL
KETONES UR: NEGATIVE mg/dL
Leukocytes,Ua: NEGATIVE
NITRITE: NEGATIVE
Protein, ur: NEGATIVE mg/dL
SPECIFIC GRAVITY, URINE: 1.025 (ref 1.005–1.030)
pH: 6 (ref 5.0–8.0)

## 2018-06-04 LAB — COMPREHENSIVE METABOLIC PANEL
ALT: 31 U/L (ref 0–44)
AST: 23 U/L (ref 15–41)
Albumin: 4.4 g/dL (ref 3.5–5.0)
Alkaline Phosphatase: 78 U/L (ref 38–126)
Anion gap: 8 (ref 5–15)
BILIRUBIN TOTAL: 0.3 mg/dL (ref 0.3–1.2)
BUN: 15 mg/dL (ref 6–20)
CHLORIDE: 105 mmol/L (ref 98–111)
CO2: 25 mmol/L (ref 22–32)
CREATININE: 0.96 mg/dL (ref 0.61–1.24)
Calcium: 8.9 mg/dL (ref 8.9–10.3)
GFR calc Af Amer: >60 mL/min (ref >60)
GLUCOSE: 90 mg/dL (ref 70–99)
Potassium: 3.8 mmol/L (ref 3.5–5.1)
Sodium: 138 mmol/L (ref 135–145)
TOTAL PROTEIN: 7.4 g/dL (ref 6.5–8.1)

## 2018-06-04 LAB — LIPASE, BLOOD: LIPASE: 38 U/L (ref 11–51)

## 2018-06-04 LAB — CBG MONITORING, ED
GLUCOSE-CAPILLARY: 80 mg/dL (ref 70–99)
Glucose-Capillary: 72 mg/dL (ref 70–99)

## 2018-06-04 LAB — TROPONIN I: Troponin I: <0.03 ng/mL (ref <0.03)

## 2018-06-04 MED ORDER — MECLIZINE HCL 25 MG PO TABS
25.0000 mg | ORAL_TABLET | Freq: Once | ORAL | Status: AC
  Administered 2018-06-04: 25 mg via ORAL
  Filled 2018-06-04: qty 1

## 2018-06-04 MED ORDER — ONDANSETRON HCL 4 MG/2ML IJ SOLN
4.0000 mg | Freq: Once | INTRAMUSCULAR | Status: AC
Start: 2018-06-04 — End: 2018-06-04
  Administered 2018-06-04: 4 mg via INTRAVENOUS
  Filled 2018-06-04: qty 2

## 2018-06-04 MED ORDER — SODIUM CHLORIDE 0.9 % IV BOLUS
1000.0000 mL | Freq: Once | INTRAVENOUS | Status: AC
  Administered 2018-06-04: 1000 mL via INTRAVENOUS

## 2018-06-04 NOTE — ED Notes (Addendum)
Pt states lower back pain, abdominal pain, nausea, lightheadedness, bilateral hand tingling, denies numbness.  Pt has a general feeling of not feeling well.  Pt states that when he stands up he feels like he is going to fall down.   Denies: ha, cp, sob. Pt states left hand started tingling at 1900 and the right hand started tingling upon arrival to ED. Pt states they are both still tingling.

## 2018-06-04 NOTE — Discharge Instructions (Addendum)
As we discussed, your work-up here was reassuring.  Your EKG did show a PVC.  As we discussed, please be followed up with your primary care doctor.  Additionally, your CT head was normal.   Follow-up with your primary care doctor.  Continue to keep your appointment with GI.  Make sure you are staying hydrated drinking plenty of fluids.  Return emergency department for any chest pain, difficulty breathing, difficulty walking, vomiting or any other worsening or concerning symptoms.

## 2018-06-04 NOTE — ED Triage Notes (Addendum)
Pt c/o generalized weakness x 5 days , seen here 5 days ago for same, pt has GI appt on wed

## 2018-06-04 NOTE — ED Provider Notes (Signed)
[facility] EMERGENCY DEPARTMENT Provider Note   CSN: [id number] Arrival date & time: 06/04/18  1931    History   Chief Complaint Chief Complaint  Patient presents with  . Weakness    HPI Osie Amparo is a 43 y.o. male who presents for evaluation of generalized weakness, lightheadedness, nausea, tingling sensation in bilateral hands.  Patient reports that he was seen here on 05/31/2018 for evaluation of abdominal pain.  At that time, his labs and CT scan were unremarkable.  He was discharged home and is scheduled to follow-up with GI later this week.  Patient states that his pain has not worsened but states that his pain has also not improved.  Patient reports that today, he was cooking dinner and states that he felt lightheaded.  He denies a room spinning or dizziness sensation.  He states "he feels like he is going to fall down" but states he does not feel like he is off balance or has any gait abnormality.  Patient also feels like maybe he is having some difficulty thinking and is not thinking clearly.  He states that he went to the refrigerator went to go grab something and then stopped and grabbed the wrong thing.  Patient also reports that he started developing some tingling sensation that is isolated to bilateral hands.  He states that started in his left hand first and then went to his right hand.  Patient denies any weakness or difficulty moving his extremities.  He states that the tingling sensation does not extend past his hands.  Patient states he is continued to have nausea since being seen in the ED previously.  He has not had any vomiting but does report he has had some decreased appetite.  Patient was last bowel movement was yesterday and was normal.  Patient denies any headache, vision changes, chest pain, vomiting, dizziness.  He denies any exogenous hormone use, recent immobilization, prior history of DVT/PE, recent surgery, leg swelling, or long travel.     The history  is provided by the patient.    Past Medical History:  Diagnosis Date  . Family history of neurofibromatosis   . History of kidney stones 04/2012  . 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 Left ankle pain 04/19/2017    Past Surgical History:  Procedure Laterality Date  . KNEE ARTHROSCOPY Bilateral   . SHOULDER ARTHROSCOPY Right 10/26/2012   Procedure: RIGHT ARTHROSCOPY SHOULDER WITH EXTENSIVE DEBRIDEMENT;  Surgeon: Eulas Post, MD;  Location: [facility];  Service: Orthopedics;  Laterality: Right;  . SHOULDER ARTHROSCOPY W/ ROTATOR CUFF REPAIR Left   . TONSILLECTOMY          Home Medications    Prior to Admission medications   Medication Sig Start Date End Date Taking? Authorizing Provider  methocarbamol (ROBAXIN) 500 MG tablet Take 1 tablet (500 mg total) by mouth 4 (four) times daily. Patient not taking: Reported on 10/25/2016 10/26/12   Teryl Lucy, MD  NONFORMULARY OR COMPOUNDED ITEM [facility]:  Onychomycosis Nail Lacquer - Fluconazole 2%, Terbinafine 1%, DMSO, apply to affected area daily. 11/10/16   Vivi Barrack, DPM  oxyCODONE-acetaminophen (ROXICET) 5-325 MG per tablet Take 1-2 tablets by mouth every 6 (six) hours as needed for pain. Patient not taking: Reported on 10/25/2016 10/26/12   Teryl Lucy, MD  promethazine (PHENERGAN) 25 MG tablet Take 1 tablet (25 mg total) by mouth every  6 (six) hours as needed for nausea. Patient not taking: Reported on 10/25/2016 10/26/12   Teryl Lucy, MD  sennosides-docusate sodium (SENOKOT-S) 8.6-50 MG tablet Take 2 tablets by mouth daily. Patient not taking: Reported on 10/25/2016 10/26/12   Teryl Lucy, MD  terbinafine (LAMISIL) 250 MG tablet Take 1 tablet (250 mg total) by mouth daily. 11/04/16   Vivi Barrack, DPM    Family History History reviewed. No pertinent family history.  Social History Social History   Tobacco Use  .  Smoking status: Former Smoker    Last attempt to quit: 03/14/2000    Years since quitting: 18.2  . Smokeless tobacco: Never Used  Substance Use Topics  . Alcohol use: Not Currently    Comment: occasionally  . Drug use: No     Allergies   Terbinafine   Review of Systems Review of Systems  Constitutional: Negative for fever.  Eyes: Negative for visual disturbance.  Respiratory: Negative for cough and shortness of breath.   Cardiovascular: Negative for chest pain.  Gastrointestinal: Positive for nausea. Negative for abdominal pain and vomiting.  Genitourinary: Negative for dysuria and hematuria.  Neurological: Positive for light-headedness and numbness. Negative for dizziness, weakness and headaches.  All other systems reviewed and are negative.    Physical Exam Updated Vital Signs BP 135/86   Pulse 65   Temp 97.6 F (36.4 C)   Resp (!) 22   Ht  (1.778 m)   Wt 87 kg   SpO2 98%   BMI 27.52 kg/m   Physical Exam Vitals signs and nursing note reviewed.  Constitutional:      Appearance: Normal appearance. He is well-developed.  HENT:     Head: Normocephalic and atraumatic.  Eyes:     General: Lids are normal.     Extraocular Movements: Extraocular movements intact.     Conjunctiva/sclera: Conjunctivae normal.     Pupils: Pupils are equal, round, and reactive to light.     Comments: PERRL. EOMs intact without any difficulty.   Neck:     Musculoskeletal: Full passive range of motion without pain.  Cardiovascular:     Rate and Rhythm: Normal rate and regular rhythm.     Pulses: Normal pulses.          Radial pulses are 2+ on the right side and 2+ on the left side.       Dorsalis pedis pulses are 2+ on the right side and 2+ on the left side.     Heart sounds: Normal heart sounds. No murmur. No friction rub. No gallop.   Pulmonary:     Effort: Pulmonary effort is normal.     Breath sounds: Normal breath sounds.     Comments: Lungs clear to auscultation  bilaterally.  Symmetric chest rise.  No wheezing, rales, rhonchi. Abdominal:     Palpations: Abdomen is soft. Abdomen is not rigid.     Tenderness: There is no abdominal tenderness. There is no guarding.     Comments: Abdomen is soft, non-distended, non-tender. No rigidity, No guarding. No peritoneal signs.  Musculoskeletal: Normal range of motion.  Skin:    General: Skin is warm and dry.     Capillary Refill: Capillary refill takes less than 2 seconds.  Neurological:     Mental Status: He is alert and oriented to person, place, and time.     Comments: Cranial nerves III-XII intact Follows commands, Moves all extremities  5/5 strength to BUE and BLE  Tingling sensation noted  to bilateral finger. Otherwise sensation intact throughout all major nerve distributions Normal finger to nose. No dysdiadochokinesia. No pronator drift. No gait abnormalities Negative Rhomberg Normal heel to toe walk No slurred speech. No facial droop.   Psychiatric:        Speech: Speech normal.      ED Treatments / Results  Labs (all labs ordered are listed, but only abnormal results are displayed) Labs Reviewed  URINALYSIS, ROUTINE W REFLEX MICROSCOPIC - Abnormal; Notable for the following components:      Result Value   Hgb urine dipstick SMALL (*)    All other components within normal limits  URINALYSIS, MICROSCOPIC (REFLEX) - Abnormal; Notable for the following components:   Bacteria, UA RARE (*)    All other components within normal limits  COMPREHENSIVE METABOLIC PANEL  CBC WITH DIFFERENTIAL/PLATELET  LIPASE, BLOOD  TROPONIN I  CBG MONITORING, ED  CBG MONITORING, ED    EKG EKG Interpretation  Date/Time:  Monday June 04 2018 20:28:04 EDT Ventricular Rate:  56 PR Interval:    QRS Duration: 114 QT Interval:  450 QTC Calculation: 435 R Axis:   33 Text Interpretation:  Sinus rhythm Ventricular premature complex Incomplete right bundle branch block Baseline wander in lead(s) V5 V6 PVC  new since previous Confirmed by Richardean Canal [phone]) on 06/04/2018 8:30:35 PM   Radiology Dg Chest 2 View  Result Date: 06/04/2018 CLINICAL DATA:  Dizziness EXAM: CHEST - 2 VIEW COMPARISON:  None. FINDINGS: Lungs are clear. Heart is mildly enlarged with pulmonary vascularity normal. No adenopathy. No bone lesions. IMPRESSION: Mild cardiac enlargement.  No edema or consolidation. Electronically Signed   By: Bretta Bang III M.D.   On: 06/04/2018 21:55   Ct Head Wo Contrast  Result Date: 06/04/2018 CLINICAL DATA:  Numbness or tingling, paresthesia EXAM: CT HEAD WITHOUT CONTRAST TECHNIQUE: Contiguous axial images were obtained from the base of the skull through the vertex without intravenous contrast. COMPARISON:  None. FINDINGS: Brain: No intracranial hemorrhage, mass effect, or midline shift. 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 No hydrocephalus. The basilar cisterns are patent. No evidence of territorial infarct or acute ischemia. No extra-axial or intracranial fluid collection. Vascular: No hyperdense vessel or unexpected calcification. Skull: No fracture or focal lesion. Sinuses/Orbits: Paranasal sinuses and mastoid air cells are clear. The visualized orbits are unremarkable. Other: None. IMPRESSION: No acute intracranial abnormality. Electronically Signed   By: Narda Rutherford M.D.   On: 06/04/2018 22:29    Procedures Procedures (including critical care time)  Medications Ordered in ED Medications  sodium chloride 0.9 % bolus 1,000 mL ( Intravenous Stopped 06/04/18 2144)  ondansetron (ZOFRAN) injection 4 mg (4 mg Intravenous Given 06/04/18 2033)  meclizine (ANTIVERT) tablet 25 mg (25 mg Oral Given 06/04/18 2032)     Initial Impression / Assessment and Plan / ED Course  I have reviewed the triage vital signs and the nursing notes.  Pertinent labs & imaging results that were available during my care of the patient were reviewed by me and considered in my medical  decision making (see chart for details).        43 year old male who presents for evaluation of lightheadedness, nausea, "not feeling right."  Has had abdominal pain over the last week.  Seen in the ED on 05/31/2018.  Work-up at that time was unremarkable and he is scheduled to follow-up with GI.  Denies any chest pain, difficulty breathing.  He states that it is not a dizziness, room spinning  sensation but it feels like he is going to fall down.  Also reports some tingling sensation to bilateral fingertips that began during this. Patient is afebrile, non-toxic appearing, sitting comfortably on examination table. Vital signs reviewed and stable.  No neuro deficits on exam.  Low suspicion for ACS etiology but also consideration.  Consider infectious etiology.  History/physical exam not concerning for CVA as his presentation is not typical of a CVA pattern.  I suspect that the tingling is most likely due to anxiety.  History/physical exam not concerning for dissection.  History/physical exam not concerning for PE.  Patient is PERC negative and is low risk.  Consider vertigo versus dehydration.  Will plan to check labs, EKG.  CBG is 80.  UA negative for any infectious process.  Lipase unremarkable.  CMP unremarkable.  CBC unremarkable.  Chest x-ray shows cardiomegaly but no evidence of edema.  No evidence of infiltrate.  EKG shows an PVC.  Otherwise unremarkable.  Troponin normal.  Discussed results with patient.  He states that the tingling sensation has improved.  He also reports nausea and lightheadedness has improved.  I attempted to ambulate the patient and he states that when he got up, he felt like "he is in a fogand that he is just having trouble thinking."  No ataxia noted.  Negative Romberg.  Patient having difficulty describing exactly what sensation he is feeling.  I asked him if he felt like there is fogginess vision but he declined and said that it is more in his head feeling like he just cannot  think right.  We will plan to add on CT head for evaluation for any acute intracranial abnormality.   CT head negative for any acute intracranial abnormality.  Discussed results with patient.  At this time, do not suspect his symptoms are related to CVA, posterior circulation stroke.  Indication for further MRI testing given history/physical exam.  Additionally, patient able to walk without any ataxia.  I did discuss with patient regarding PVC present on his EKG.  Encouraged him to follow-up with his primary care doctor. At this time, patient exhibits no emergent life-threatening condition that require further evaluation in ED or admission. Patient had ample opportunity for questions and discussion. All patient's questions were answered with full understanding. Strict return precautions discussed. Patient expresses understanding and agreement to plan.   Portions of this note were generated with Scientist, clinical (histocompatibility and immunogenetics). Dictation errors may occur despite best attempts at proofreading.   Final Clinical Impressions(s) / ED Diagnoses   Final diagnoses:  Lightheadedness  Generalized weakness  PVC (premature ventricular contraction)    ED Discharge Orders    None       Rosana Hoes 06/04/18 2312    Charlynne Pander, MD 06/11/18 [phone]

## 2018-06-04 NOTE — ED Notes (Signed)
PA at bedside.

## 2019-05-14 ENCOUNTER — Other Ambulatory Visit: Payer: Self-pay

## 2019-05-14 ENCOUNTER — Encounter: Payer: Self-pay | Admitting: Family Medicine

## 2019-05-14 ENCOUNTER — Ambulatory Visit: Payer: Self-pay

## 2019-05-14 ENCOUNTER — Ambulatory Visit (INDEPENDENT_AMBULATORY_CARE_PROVIDER_SITE_OTHER): Payer: BC Managed Care – PPO | Admitting: Family Medicine

## 2019-05-14 VITALS — Ht 70.0 in | Wt 175.0 lb

## 2019-05-14 DIAGNOSIS — M25511 Pain in right shoulder: Secondary | ICD-10-CM | POA: Diagnosis not present

## 2019-05-14 NOTE — Patient Instructions (Signed)
Nice to meet you Please try ice  Please try vitamin K2  Please try the sling for one week and then as needed after that  Please try compression  Please try the range of motion   Please send me a message in MyChart with any questions or updates.  Please see me back in 3 weeks.   --Dr. Jordan Likes

## 2019-05-14 NOTE — Assessment & Plan Note (Signed)
Acute injury while hiking.  He has significant degenerative change of the joint.  Has had previous surgeries of the labrum as well as rotator cuff.  On exam to suggest more of a nondisplaced small chip fracture of the lateral bicipital groove.  There was no significant effusion within the joint but likely has some exacerbation of underlying degenerative changes as well. -Sling. -Counseled on compression with running. -Counseled on supportive care and home exercise therapy. -We will follow-up in a few weeks.  If still symptomatic would consider an MRI.

## 2019-05-14 NOTE — Progress Notes (Signed)
Christian Horne - 44 y.o. male MRN [id number]  Date of birth: 1975-09-21  SUBJECTIVE:  Including CC & ROS.  Chief Complaint  Patient presents with  . Shoulder Injury    right shoulder x 05/03/2019    Christian Horne is a 44 y.o. male that is presenting with acute right shoulder pain.  He was hiking recently and had a fall about 3 different times.  He fell on his right shoulder with that tucked in.  He also fell with his arm in flexion and stretched out away from him.  He has a history of arthroscopy where his labrum was debrided.  He also has a distant history of rotator cuff repair.  Pain is intermittent in nature.  Seems to be significantly worse with extended periods of running.  Review of the right shoulder x-ray from 3/2 shows no acute abnormality.   Review of Systems See HPI   HISTORY: Past Medical, Surgical, Social, and Family History Reviewed & Updated per EMR.   Pertinent Historical Findings include:  Past Medical History:  Diagnosis Date  . Family history of neurofibromatosis   . History of kidney stones 04/2012  . 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 KNEE ARTHROSCOPY Bilateral   . SHOULDER ARTHROSCOPY Right 10/26/2012   Procedure: RIGHT ARTHROSCOPY SHOULDER WITH EXTENSIVE DEBRIDEMENT;  Surgeon: Johnny Bridge, MD;  Location: [facility];  Service: Orthopedics;  Laterality: Right;  . SHOULDER ARTHROSCOPY W/ ROTATOR CUFF REPAIR Left   . TONSILLECTOMY      No family history on file.  Social History   Socioeconomic History  . Marital status: Married    Spouse name: Not on file  . Number of children: Not on file  . Years of education: Not on file  . Highest education level: Not on file  Occupational History  . Not on file  Tobacco Use  . Smoking status: Former Smoker    Quit date: 03/14/2000    Years since quitting: 19.1  . Smokeless tobacco: Never Used  Substance and Sexual Activity  .  Alcohol use: Not Currently    Comment: occasionally  . Drug use: No  . Sexual activity: Not on file  Other Topics Concern  . Not on file  Social History Narrative  . Not on file   Social Determinants of Health   Financial Resource Strain:   . Difficulty of Paying Living Expenses: Not on file  Food Insecurity:   . Worried About Charity fundraiser in the Last Year: Not on file  . Ran Out of Food in the Last Year: Not on file  Transportation Needs:   . Lack of Transportation (Medical): Not on file  . Lack of Transportation (Non-Medical): Not on file  Physical Activity:   . Days of Exercise per Week: Not on file  . Minutes of Exercise per Session: Not on file  Stress:   . Feeling of Stress : Not on file  Social Connections:   . Frequency of Communication with Friends and Family: Not on file  . Frequency of Social Gatherings with Friends and Family: Not on file  . Attends Religious Services: Not on file  . Active Member of Clubs or Organizations: Not on file  . Attends Archivist Meetings: Not on file  . Marital Status: Not on file  Intimate Partner Violence:   . Fear of Current or Ex-Partner: Not on file  . Emotionally  Abused: Not on file  . Physically Abused: Not on file  . Sexually Abused: Not on file     PHYSICAL EXAM:  VS: Ht 5\' 10"  (1.778 m)   Wt 175 lb (79.4 kg)   BMI 25.11 kg/m  Physical Exam Gen: NAD, alert, cooperative with exam, well-appearing MSK:  Right shoulder: Limited external rotation. Limited internal rotation with abduction. Normal strength with empty can. Normal speeds test. Normal O'Brien's test. Neurovascularly intact  Limited ultrasound: Right shoulder:  Normal-appearing biceps tendon in long and short axis. There is a cortical irregularity just lateral to the bicipital groove.  There is increased hyperemia in this area.  This could suggest a nondisplaced fracture of the lateral portion of the bicipital groove. Anterior leaflet  of the supraspinatus is chronic changes.  Of the leaflets appear to be normal. No significant effusion appreciated in the posterior glenohumeral joint  Summary: Findings would suggest a possible chip lesion of the lateral bicipital groove  Ultrasound and interpretation by , MD    ASSESSMENT & PLAN:   Acute pain of right shoulder Acute injury while hiking.  He has significant degenerative change of the joint.  Has had previous surgeries of the labrum as well as rotator cuff.  On exam to suggest more of a nondisplaced small chip fracture of the lateral bicipital groove.  There was no significant effusion within the joint but likely has some exacerbation of underlying degenerative changes as well. -Sling. -Counseled on compression with running. -Counseled on supportive care and home exercise therapy. -We will follow-up in a few weeks.  If still symptomatic would consider an MRI.

## 2020-04-04 ENCOUNTER — Other Ambulatory Visit: Payer: Self-pay

## 2020-04-04 ENCOUNTER — Emergency Department (HOSPITAL_BASED_OUTPATIENT_CLINIC_OR_DEPARTMENT_OTHER): Payer: BC Managed Care – PPO

## 2020-04-04 ENCOUNTER — Emergency Department (HOSPITAL_BASED_OUTPATIENT_CLINIC_OR_DEPARTMENT_OTHER)
Admission: EM | Admit: 2020-04-04 | Discharge: 2020-04-04 | Disposition: A | Discharge status: Home / Self Care | Payer: BC Managed Care – PPO | Attending: Emergency Medicine | Admitting: Emergency Medicine

## 2020-04-04 ENCOUNTER — Encounter (HOSPITAL_BASED_OUTPATIENT_CLINIC_OR_DEPARTMENT_OTHER): Payer: Self-pay | Admitting: Emergency Medicine

## 2020-04-04 DIAGNOSIS — Z87891 Personal history of nicotine dependence: Secondary | ICD-10-CM | POA: Insufficient documentation

## 2020-04-04 DIAGNOSIS — R110 Nausea: Secondary | ICD-10-CM | POA: Diagnosis not present

## 2020-04-04 DIAGNOSIS — R079 Chest pain, unspecified: Secondary | ICD-10-CM | POA: Insufficient documentation

## 2020-04-04 DIAGNOSIS — R0602 Shortness of breath: Secondary | ICD-10-CM

## 2020-04-04 LAB — BASIC METABOLIC PANEL
Anion gap: 10 (ref 5–15)
BUN: 15 mg/dL (ref 6–20)
CO2: 27 mmol/L (ref 22–32)
Calcium: 9.4 mg/dL (ref 8.9–10.3)
Chloride: 100 mmol/L (ref 98–111)
Creatinine, Ser: 1.24 mg/dL (ref 0.61–1.24)
GFR, Estimated: >60 mL/min (ref >60)
Glucose, Bld: 99 mg/dL (ref 70–99)
Potassium: 4.7 mmol/L (ref 3.5–5.1)
Sodium: 137 mmol/L (ref 135–145)

## 2020-04-04 LAB — CBC
HCT: 45.7 % (ref 39.0–52.0)
Hemoglobin: 15.1 g/dL (ref 13.0–17.0)
MCH: 30.4 pg (ref 26.0–34.0)
MCHC: 33 g/dL (ref 30.0–36.0)
MCV: 92 fL (ref 80.0–100.0)
Platelets: 263 10*3/uL (ref 150–400)
RBC: 4.97 MIL/uL (ref 4.22–5.81)
RDW: 12.8 % (ref 11.5–15.5)
WBC: 6.6 10*3/uL (ref 4.0–10.5)
nRBC: 0 % (ref 0.0–0.2)

## 2020-04-04 LAB — TROPONIN I (HIGH SENSITIVITY): Troponin I (High Sensitivity): 3 ng/L (ref <18)

## 2020-04-04 MED ORDER — METHOCARBAMOL 500 MG PO TABS: 500.0000 mg | ORAL_TABLET | Freq: Three times a day (TID) | ORAL | 1 refills | Status: AC

## 2020-04-04 NOTE — ED Provider Notes (Signed)
[facility] EMERGENCY DEPARTMENT Provider Note   CSN: [id number] Arrival date & time: 04/04/20  1245     History Chief Complaint  Patient presents with  . Chest Pain    Christian Horne is a 45 y.o. male.  HPI Patient is 45 year old male with past medical history of bradycardia, shoulder impingement, kidney stones, neurofibromatosis  Patient states that he is vaccinated against COVID-19 states that he tested +1/20 after having symptoms that began on the 14th.  He states that his symptoms have been mild and seem to have completely resolved.  He is not having any cough or shortness of breath.  Denies any lightheadedness or dizziness.  He states that he has however over the past 2 days had consistent nonradiating feels like a pressure he states no nausea or vomiting or diaphoresis States his symptoms are nonexertional.  He states that he ran 3 miles yesterday without any difficulty or worsening pain.  He states he runs 50-60 miles per week.  And has continued to run with his ongoing chest pain does not seem to have any worsening with exertion also denies any pleuritic pain.  No recent surgeries, hospitalization, long travel, hemoptysis, estrogen containing OCP, cancer history.  No unilateral leg swelling.  No history of PE or VTE.  HPI: A 45 year old patient presents for evaluation of chest pain. Initial onset of pain was more than 6 hours ago. The patient's chest pain is not worse with exertion. The patient's chest pain is middle- or left-sided, is not well-localized, is not described as heaviness/pressure/tightness, is not sharp and does not radiate to the arms/jaw/neck. The patient does not complain of nausea and denies diaphoresis. The patient has no history of stroke, has no history of peripheral artery disease, has not smoked in the past 90 days, denies any history of treated diabetes, has no relevant family history of coronary artery disease (first degree relative at less than age  79), is not hypertensive, has no history of hypercholesterolemia and does not have an elevated BMI (>=30).   Past Medical History:  Diagnosis Date  . Family history of neurofibromatosis   . History of kidney stones 04/2012  . 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 Acute pain of right shoulder 05/14/2019  . Left ankle pain 04/19/2017    Past Surgical History:  Procedure Laterality Date  . KNEE ARTHROSCOPY Bilateral   . SHOULDER ARTHROSCOPY Right 10/26/2012   Procedure: RIGHT ARTHROSCOPY SHOULDER WITH EXTENSIVE DEBRIDEMENT;  Surgeon: Eulas Post, MD;  Location: [facility];  Service: Orthopedics;  Laterality: Right;  . SHOULDER ARTHROSCOPY W/ ROTATOR CUFF REPAIR Left   . TONSILLECTOMY         History reviewed. No pertinent family history.  Social History   Tobacco Use  . Smoking status: Former Smoker    Quit date: 03/14/2000    Years since quitting: 20.0  . Smokeless tobacco: Never Used  Vaping Use  . Vaping Use: Never used  Substance Use Topics  . Alcohol use: Not Currently    Comment: occasionally  . Drug use: No    Home Medications Prior to Admission medications   Medication Sig Start Date End Date Taking? Authorizing Provider  methocarbamol (ROBAXIN) 500 MG tablet Take 1 tablet (500 mg total) by mouth 3 (three) times daily. 04/04/20   Gailen Shelter, PA  NONFORMULARY OR COMPOUNDED ITEM [facility]:  Onychomycosis Nail Lacquer - Fluconazole  2%, Terbinafine 1%, DMSO, apply to affected area daily. 11/10/16   Vivi Barrack, DPM  oxyCODONE-acetaminophen (ROXICET) 5-325 MG per tablet Take 1-2 tablets by mouth every 6 (six) hours as needed for pain. Patient not taking: Reported on 10/25/2016 10/26/12   Teryl Lucy, MD  promethazine (PHENERGAN) 25 MG tablet Take 1 tablet (25 mg total) by mouth every 6 (six) hours as needed for nausea. Patient not taking: Reported on 10/25/2016 10/26/12    Teryl Lucy, MD  sennosides-docusate sodium (SENOKOT-S) 8.6-50 MG tablet Take 2 tablets by mouth daily. Patient not taking: Reported on 10/25/2016 10/26/12   Teryl Lucy, MD  terbinafine (LAMISIL) 250 MG tablet Take 1 tablet (250 mg total) by mouth daily. 11/04/16   Vivi Barrack, DPM    Allergies    Terbinafine  Review of Systems   Review of Systems  Constitutional: Positive for fatigue. Negative for chills and fever.  HENT: Negative for congestion.   Eyes: Negative for pain.  Respiratory: Negative for cough and shortness of breath.   Cardiovascular: Positive for chest pain. Negative for leg swelling.  Gastrointestinal: Negative for abdominal pain, diarrhea, nausea and vomiting.  Genitourinary: Negative for dysuria.  Musculoskeletal: Negative for myalgias.  Skin: Negative for rash.  Neurological: Negative for dizziness and headaches.  Psychiatric/Behavioral: Negative for sleep disturbance. The patient is not nervous/anxious.     Physical Exam Updated Vital Signs BP (!) 154/99 (BP Location: Right Arm)   Pulse (!) 45   Temp 98.2 F (36.8 C) (Oral)   Resp 18   Ht 5\' 10"  (1.778 m)   Wt 83.9 kg   SpO2 98%   BMI 26.54 kg/m   Physical Exam Vitals and nursing note reviewed.  Constitutional:      General: He is not in acute distress.    Comments: Pleasant well-appearing 44 year old.  In no acute distress.  Sitting comfortably in bed.  Able answer questions appropriately follow commands. No increased work of breathing. Speaking in full sentences.  HENT:     Head: Normocephalic and atraumatic.     Nose: Nose normal.     Mouth/Throat:     Mouth: Mucous membranes are moist.  Eyes:     General: No scleral icterus. Cardiovascular:     Rate and Rhythm: Normal rate and regular rhythm.     Pulses: Normal pulses.     Heart sounds: Normal heart sounds.  Pulmonary:     Effort: Pulmonary effort is normal. No respiratory distress.     Breath sounds: No wheezing.      Comments: No TTP of chest Chest:     Chest wall: No tenderness.  Abdominal:     Palpations: Abdomen is soft.     Tenderness: There is no abdominal tenderness. There is no guarding or rebound.  Musculoskeletal:     Cervical back: Normal range of motion.     Right lower leg: No edema.     Left lower leg: No edema.     Comments: No calf tenderness.  No unilateral or bilateral leg swelling no edema.  Skin:    General: Skin is warm and dry.     Capillary Refill: Capillary refill takes less than 2 seconds.  Neurological:     Mental Status: He is alert. Mental status is at baseline.  Psychiatric:        Mood and Affect: Mood normal.        Behavior: Behavior normal.     ED Results / Procedures / Treatments  Labs (all labs ordered are listed, but only abnormal results are displayed) Labs Reviewed  BASIC METABOLIC PANEL  CBC  TROPONIN I (HIGH SENSITIVITY)    EKG EKG Interpretation  Date/Time:  Saturday April 04 2020 12:48:57 EST Ventricular Rate:  41 PR Interval:  142 QRS Duration: 102 QT Interval:  476 QTC Calculation: 392 R Axis:   60 Text Interpretation: Marked sinus bradycardia Incomplete right bundle branch block Abnormal ECG No significant change from prior ecg No STEMI Confirmed by Alvester Chourifan, Matthew [phone]([id number]) on 04/04/2020 3:07:46 PM   Radiology DG Chest Port 1 View  Result Date: 04/04/2020 CLINICAL DATA:  Chest pain for 3 days.  COVID positive. EXAM: PORTABLE CHEST 1 VIEW COMPARISON:  June 04, 2018 FINDINGS: The heart size and mediastinal contours are within normal limits. Both lungs are clear. The visualized skeletal structures are unremarkable. IMPRESSION: No active disease. Electronically Signed   By: Sherian ReinWei-Chen  Lin M.D.   On: 04/04/2020 14:35    Procedures Procedures (including critical care time)  Medications Ordered in ED Medications - No data to display  ED Course  I have reviewed the triage vital signs and the nursing notes.  Pertinent labs & imaging  results that were available during my care of the patient were reviewed by me and considered in my medical decision making (see chart for details).    MDM Rules/Calculators/A&P HEAR Score: 1                        Patient is a 45 year old male very active has resting bradycardia I reviewed prior notes and he has had persistent bradycardia in the past.  Typically between 40 and 50/min.  Patient presented today with constant pressure left-sided chest pain. It is nonexertional nonpleuritic.  Patient is PERC negative.  SPO2 remains 97 to 100% on room air.  No tachycardia.  He denies any sharp or stabbing chest pain.  Doubt PE as patient denies pleuritic component of CP, denies history of clot, active cancer, immobilization, surgery, hemoptysis, known clotting disorder, is on no estrogen containing medications, denies calf pain and on physical exam has no unilateral leg swelling, calf TTP, tachycardia or hypoxia.   Low suspicion for ACS  Doubt thoracic aortic dissection as pain lacks tearing or severe quality and does not radiate to back. Patient denies nausea, diaphoresis. Symmetric pulses, no murmur, no neurologic deficit. CXR without widened mediastinum.   Doubt myocarditis given negative troponin.  Pericarditis not completely ruled out he does have some positional discomfort.  Chest x-ray without any cardiomegaly.  No infiltrate or pneumonia.  EKG notable for sinus bradycardia.  Reviewed by attending physician.  Troponin x1 3.  Patient states that his chest pain is been persistent for greater than 6 hours.  Indication for delta troponin.  CBC Jamesetta Sohyllis this is her anemia.  BMP without any electrolyte abnormality.  Kidney function normal.  Montine CircleChris Bozzo was evaluated in Emergency Department on 04/04/2020 for the symptoms described in the history of present illness. He was evaluated in the context of the global COVID-19 pandemic, which necessitated consideration that the patient might be at risk for  infection with the SARS-CoV-2 virus that causes COVID-19. Institutional protocols and algorithms that pertain to the evaluation of patients at risk for COVID-19 are in a state of rapid change based on information released by regulatory bodies including the CDC and federal and state organizations. These policies and algorithms were followed during the patient's care in the ED.  Will treat  as musculoskeletal patient given strict return precautions.  Final Clinical Impression(s) / ED Diagnoses Final diagnoses:  Chest pain, unspecified type    Additional instructions  Patient given strict return precautions  The medical records were personally reviewed by myself. I personally reviewed all lab results and interpreted all imaging studies and either concurred with their official read or contacted radiology for clarification.   This patient appears reasonably screened and I doubt any other medical condition requiring further workup, evaluation, or treatment in the ED at this time prior to discharge.   Patient's vitals are WNL apart from vital sign abnormalities discussed above, patient is in NAD, and able to ambulate in the ED at their baseline and able to tolerate PO.  Pain has been managed or a plan has been made for home management and has no complaints prior to discharge. Patient is comfortable with above plan and for discharge at this time. All questions were answered prior to disposition. Results from the ER workup discussed with the patient face to face and all questions answered to the best of my ability. The patient is safe for discharge with strict return precautions. Patient appears safe for discharge with appropriate follow-up. Conveyed my impression with the patient and they voiced understanding and are agreeable to plan.   An After Visit Summary was printed and given to the patient.  Portions of this note were generated with Scientist, clinical (histocompatibility and immunogenetics). Dictation errors may occur despite  best attempts at proofreading.    Rx / DC Orders ED Discharge Orders         Ordered    methocarbamol (ROBAXIN) 500 MG tablet  3 times daily        04/04/20 1518           Solon Augusta [place], [place] 04/04/20 1914    Terald Sleeper, MD 04/05/20 [phone]

## 2020-04-04 NOTE — Discharge Instructions (Signed)
Please monitor your symptoms Please return to the ER for any new or concerning symptoms such as we discussed. Please keep your appoint with your cardiologist to follow-up on your bradycardia. Please drink plenty of water. I have prescribed you a muscle relaxer which may help with this is a muscle strain/musculoskeletal pain.  May also take ibuprofen and Tylenol as discussed below  Please use Tylenol or ibuprofen for pain.  You may use 600 mg ibuprofen every 6 hours or 1000 mg of Tylenol every 6 hours.  You may choose to alternate between the 2.  This would be most effective.  Not to exceed 4 g of Tylenol within 24 hours.  Not to exceed 3200 mg ibuprofen 24 hours.

## 2020-04-04 NOTE — ED Triage Notes (Signed)
Pt arrives pov with c/o CP x 3 days. Pt also endorses shob. Pt Covid + on 1/20.

## 2020-04-08 ENCOUNTER — Encounter (HOSPITAL_BASED_OUTPATIENT_CLINIC_OR_DEPARTMENT_OTHER): Payer: Self-pay

## 2020-04-08 ENCOUNTER — Emergency Department (HOSPITAL_BASED_OUTPATIENT_CLINIC_OR_DEPARTMENT_OTHER): Payer: BC Managed Care – PPO

## 2020-04-08 ENCOUNTER — Other Ambulatory Visit: Payer: Self-pay

## 2020-04-08 ENCOUNTER — Emergency Department (HOSPITAL_BASED_OUTPATIENT_CLINIC_OR_DEPARTMENT_OTHER)
Admission: EM | Admit: 2020-04-08 | Discharge: 2020-04-08 | Disposition: A | Discharge status: Home / Self Care | Payer: BC Managed Care – PPO | Attending: Emergency Medicine | Admitting: Emergency Medicine

## 2020-04-08 DIAGNOSIS — U071 COVID-19: Secondary | ICD-10-CM | POA: Diagnosis not present

## 2020-04-08 DIAGNOSIS — Z87891 Personal history of nicotine dependence: Secondary | ICD-10-CM | POA: Insufficient documentation

## 2020-04-08 DIAGNOSIS — R001 Bradycardia, unspecified: Secondary | ICD-10-CM | POA: Insufficient documentation

## 2020-04-08 DIAGNOSIS — R202 Paresthesia of skin: Secondary | ICD-10-CM | POA: Diagnosis not present

## 2020-04-08 DIAGNOSIS — R079 Chest pain, unspecified: Secondary | ICD-10-CM | POA: Diagnosis present

## 2020-04-08 DIAGNOSIS — F419 Anxiety disorder, unspecified: Secondary | ICD-10-CM | POA: Diagnosis not present

## 2020-04-08 DIAGNOSIS — M79605 Pain in left leg: Secondary | ICD-10-CM

## 2020-04-08 LAB — CBC
HCT: 45.5 % (ref 39.0–52.0)
Hemoglobin: 15.3 g/dL (ref 13.0–17.0)
MCH: 30.6 pg (ref 26.0–34.0)
MCHC: 33.6 g/dL (ref 30.0–36.0)
MCV: 91 fL (ref 80.0–100.0)
Platelets: 257 10*3/uL (ref 150–400)
RBC: 5 MIL/uL (ref 4.22–5.81)
RDW: 12.8 % (ref 11.5–15.5)
WBC: 5.5 10*3/uL (ref 4.0–10.5)
nRBC: 0 % (ref 0.0–0.2)

## 2020-04-08 LAB — BASIC METABOLIC PANEL
Anion gap: 10 (ref 5–15)
BUN: 16 mg/dL (ref 6–20)
CO2: 27 mmol/L (ref 22–32)
Calcium: 9.3 mg/dL (ref 8.9–10.3)
Chloride: 104 mmol/L (ref 98–111)
Creatinine, Ser: 0.9 mg/dL (ref 0.61–1.24)
GFR, Estimated: >60 mL/min (ref >60)
Glucose, Bld: 97 mg/dL (ref 70–99)
Potassium: 3.8 mmol/L (ref 3.5–5.1)
Sodium: 141 mmol/L (ref 135–145)

## 2020-04-08 LAB — TROPONIN I (HIGH SENSITIVITY): Troponin I (High Sensitivity): 5 ng/L (ref <18)

## 2020-04-08 MED ORDER — HYDROXYZINE HCL 25 MG PO TABS: 25.0000 mg | ORAL_TABLET | Freq: Four times a day (QID) | ORAL | 0 refills | Status: AC | PRN

## 2020-04-08 NOTE — Discharge Instructions (Signed)
You can take hydroxyzine every 6 hours as needed for anxiety. Continue hydrating. Treat your pains with OTC medications such as tylenol or advil. Follow with your primary care provider.

## 2020-04-08 NOTE — ED Provider Notes (Addendum)
[facility] EMERGENCY DEPARTMENT Provider Note   CSN: [id number] Arrival date & time: 04/08/20  0940     History Chief Complaint  Patient presents with  . Leg Pain  . Chest Pain    Christian Horne is a 45 y.o. male presenting to the ED with complaint of left leg pain that began this morning. He states he woke up this morning somewhat sweaty. He had sharp shooting pain in his left calf and into his left proximal medial thigh.  Pain in his calf is subsided.  He still has some dull pain in his left thigh currently.  He has no new swelling in his legs.  He was recently diagnosed with Covid 19 on 04/02/2020.  Per review of his medical record, he was evaluated in the ED 4 days ago on the 22nd for similar chest pains with negative work-up. He endorses persistent symptoms of intermittent tingling in his hands and some chest discomfort. It is not positional or exertional, has run without worsening symptoms.  He does endorse quite a bit of anxiety related to his COVID-19 illness.  His son also has the virus.  He states overall his Covid symptoms improved, initially had body aches and sore throat.  He however has been having the persistent symptoms of chest discomfort.  He presents back to the ED due to the new symptoms of sweaty palms, with tingling fingers and an episode of fast heart rate to 100 that lasted about 30 minutes in duration. During this episode he also felt a soreness in his left axilla.  His heart rate is normally in the 40s he needed a few episodes of heart rate in the 30s but only once was he feeling a little bit fatigued during it.  He is a runner, runs anywhere from 50 to 60 miles per week.  He has not run in the last few days because he read it was not the best idea during his current COVID-19 illness.  He works at home though is frequently getting up and moving.  No history of DVT or PE. Denies shortness of breath, nausea, abdominal pain  The history is provided by the patient  and medical records.       Past Medical History:  Diagnosis Date  . Family history of neurofibromatosis   . History of kidney stones 04/2012  . 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 Acute pain of right shoulder 05/14/2019  . Left ankle pain 04/19/2017    Past Surgical History:  Procedure Laterality Date  . KNEE ARTHROSCOPY Bilateral   . SHOULDER ARTHROSCOPY Right 10/26/2012   Procedure: RIGHT ARTHROSCOPY SHOULDER WITH EXTENSIVE DEBRIDEMENT;  Surgeon: Eulas Post, MD;  Location: [facility];  Service: Orthopedics;  Laterality: Right;  . SHOULDER ARTHROSCOPY W/ ROTATOR CUFF REPAIR Left   . TONSILLECTOMY         No family history on file.  Social History   Tobacco Use  . Smoking status: Former Smoker    Quit date: 03/14/2000    Years since quitting: 20.0  . Smokeless tobacco: Never Used  Vaping Use  . Vaping Use: Never used  Substance Use Topics  . Alcohol use: Not Currently    Comment: occasionally  . Drug use: No    Home Medications Prior to Admission medications   Medication Sig Start Date End Date Taking? Authorizing Provider  hydrOXYzine (ATARAX/VISTARIL) 25 MG tablet  Take 1 tablet (25 mg total) by mouth every 6 (six) hours as needed for anxiety. 04/08/20  Yes Allien Melberg, [place] N, PA-C  methocarbamol (ROBAXIN) 500 MG tablet Take 1 tablet (500 mg total) by mouth 3 (three) times daily. 04/04/20   Gailen Shelter, PA  [facility]FORMULARY OR [facility]OUNDED [facility]EM [facility]:  Onychomycosis Nail Lacquer - Fluconazole 2%, Terbinafine 1%, DMSO, apply to affected area daily. 11/10/16   Vivi Barrack, DPM  oxyCODONE-acetaminophen (ROXICET) 5-325 MG per tablet Take 1-2 tablets by mouth every 6 (six) hours as needed for pain. Patient not taking: Reported on 10/25/2016 10/26/12   Teryl Lucy, MD  promethazine (PHENERGAN) 25 MG tablet Take 1 tablet (25 mg total) by mouth every 6 (six) hours as  needed for nausea. Patient not taking: Reported on 10/25/2016 10/26/12   Teryl Lucy, MD  sennosides-docusate sodium (SENOKOT-S) 8.6-50 MG tablet Take 2 tablets by mouth daily. Patient not taking: Reported on 10/25/2016 10/26/12   Teryl Lucy, MD  terbinafine (LAMISIL) 250 MG tablet Take 1 tablet (250 mg total) by mouth daily. 11/04/16   Vivi Barrack, DPM    Allergies    Terbinafine  Review of Systems   Review of Systems  All other systems reviewed and are negative.   Physical Exam Updated Vital Signs BP 131/83   Pulse (!) 40   Temp 98.4 F (36.9 C) (Oral)   Resp 17   Ht 5\' 10"  (1.778 m)   Wt 83.9 kg   SpO2 99%   BMI 26.54 kg/m   Physical Exam Vitals and nursing note reviewed.  Constitutional:      General: He is not in acute distress.    Appearance: He is well-developed and well-nourished.     Comments: No distress.  Speaking in full sentences without difficulty.  Sitting up in bed. Does have an episode of sweaty palms while evaluating, heart rate goes up by about 20 bpm into the 60s.   HENT:     Head: Normocephalic and atraumatic.  Eyes:     Conjunctiva/sclera: Conjunctivae normal.  Cardiovascular:     Rate and Rhythm: Regular rhythm. Bradycardia present.  Pulmonary:     Effort: Pulmonary effort is normal. No respiratory distress.     Breath sounds: Normal breath sounds.  Chest:     Chest wall: No tenderness.  Abdominal:     General: Bowel sounds are normal.     Palpations: Abdomen is soft.     Tenderness: There is no abdominal tenderness. There is no guarding or rebound.  Musculoskeletal:     Right lower leg: No edema.     Left lower leg: No edema.     Comments: No tenderness to left calf.  There are some mild tenderness to the left medial thigh.  No skin changes.  Negative Homans' sign.  No swelling.  Normal DP pulse.  Skin:    General: Skin is warm.  Neurological:     Mental Status: He is alert.  Psychiatric:        Mood and Affect: Mood and  affect normal.        Behavior: Behavior normal.     ED Results / Procedures / Treatments   Labs (all labs ordered are listed, but only abnormal results are displayed) Labs Reviewed  BASIC METABOLIC PANEL  CBC  TROPONIN I (HIGH SENSITIVITY)    EKG None Vent. rate 38 BPM PR interval 142 ms QRS duration 102 ms QT/QTc 478/380 ms P-R-T axes 49 56 18  Marked sinus bradycardia Incomplete right bundle Christian block Abnormal ECG No significant change since last tracing Confirmed by Melene Plan [phone]) on 04/08/2020 10:38:03 AM  Radiology US Venous Img Lower Unilateral Left  Result Date: 04/08/2020 CLINICAL DATA:  Leg pain.  Active cavity infection. EXAM: Left LOWER EXTREMITY VENOUS DOPPLER ULTRASOUND TECHNIQUE: Gray-scale sonography with compression, as well as color and duplex ultrasound, were performed to evaluate the deep venous system(s) from the level of the common femoral vein through the popliteal and proximal calf veins. COMPARISON:  None. FINDINGS: VENOUS Normal compressibility of the common femoral, superficial femoral, and popliteal veins, as well as the visualized calf veins. Visualized portions of profunda femoral vein and great saphenous vein unremarkable. No filling defects to suggest DVT on grayscale or color Doppler imaging. Doppler waveforms show normal direction of venous flow, normal respiratory plasticity and response to augmentation. Limited views of the contralateral common femoral vein are unremarkable. OTHER None. Limitations: none IMPRESSION: No findings of left lower extremity DVT. Electronically Signed   By: Gaylyn Rong M.D.   On: 04/08/2020 11:33   DG Chest Port 1 View  Result Date: 04/08/2020 CLINICAL DATA:  Chest pain.  Recent COVID-19 positive status. EXAM: PORTABLE CHEST 1 VIEW COMPARISON:  April 04, 2020 FINDINGS: Lungs are clear. Heart is enlarged with pulmonary vascularity normal. No adenopathy. No bone lesions. No pneumothorax. IMPRESSION: Stable  cardiac prominence.  Lungs clear. Electronically Signed   By: Bretta Bang III M.D.   On: 04/08/2020 10:55    Procedures Procedures   Medications Ordered in ED Medications - No data to display  ED Course  I have reviewed the triage vital signs and the nursing notes.  Pertinent labs & imaging results that were available during my care of the patient were reviewed by me and considered in my medical decision making (see chart for details).    MDM Rules/Calculators/A&P                          Patient with recent diagnosis of COVID-19, presenting back to the ED after evaluation a few days ago for chest pain.  Today he developed sweaty palms with fast heart rate, some pain in his left axilla.  He is also having some pain in his left leg without swelling.  He is a runner, runs about 50 to 60 miles per day.  Chronically bradycardic and overall asymptomatic regards to his bradycardia.  On exam he has no swelling to his leg, some mild tenderness to the medial thigh.  Chest is nontender, chest pain is not positional.  Lungs are clear.  He has an episode of his sweaty palms that he reports subjectively during evaluation.  Cardiac monitor noted heart rate ranging from the 40s to the 60s, sinus rhythm.   Venous ultrasound left leg is negative for DVT.  Chest x-ray remains clear and unchanged since 4 days ago.  His blood work is reassuring, no electrolyte derangements.  Troponin is negative.  Do not believe delta troponin is indicated considering duration of symptoms and unchanging severity or quality of chest pain.  Unlikely ACS given absence of risk factors.  Low risk Wells.  EKG and symptomatology does not seem consistent with pericarditis today or a myocarditis.  He does endorse quite a bit of anxiety related to his COVID-19 illness, believe this is largely contributing to patient's symptoms today.  He endorses sweaty palms and tingling in his hands, his heart rate is intermittently elevating.  Will  give a prescription for hydroxyzine to use as needed for anxiety.  He has smart watch to monitor his heart rates.  Instructed if he becomes symptomatic with low heart rates, follow closely with PCP return to the ED if persisting.  Discussed results, findings, treatment and follow up. Patient advised of return precautions. Patient verbalized understanding and agreed with plan.   Christian Horne was evaluated in Emergency Department on 04/08/2020 for the symptoms described in the history of present illness. He was evaluated in the context of the global COVID-19 pandemic, which necessitated consideration that the patient might be at risk for infection with the SARS-CoV-2 virus that causes COVID-19. Institutional protocols and algorithms that pertain to the evaluation of patients at risk for COVID-19 are in a state of rapid change based on information released by regulatory bodies including the CDC and federal and state organizations. These policies and algorithms were followed during the patient's care in the ED.  Final Clinical Impression(s) / ED Diagnoses Final diagnoses:  Anxiety  COVID-19  Left leg pain    Rx / DC Orders ED Discharge Orders         Ordered    hydrOXYzine (ATARAX/VISTARIL) 25 MG tablet  Every 6 hours PRN        04/08/20 1157           Tamarra Geiselman, [place] N, PA-C 04/08/20 1157    Zhanna Melin, [place] N, PA-C 04/08/20 1159    Melene Plan, DO 04/08/20 1236

## 2020-04-08 NOTE — ED Triage Notes (Signed)
Pt arrives ambulatory with c/o pain in left calf that moved up to upper left high, pt also reports that his baseline HR is in the 40s states since Covid he has had a HR in the 30s, while working this morning and sitting his HR shot up to 102.  Was diagnosed Covid + on 04-02-20.

## 2020-10-03 ENCOUNTER — Emergency Department (HOSPITAL_BASED_OUTPATIENT_CLINIC_OR_DEPARTMENT_OTHER)
Admission: EM | Admit: 2020-10-03 | Discharge: 2020-10-03 | Disposition: A | Discharge status: Home / Self Care | Payer: BC Managed Care – PPO | Attending: Emergency Medicine | Admitting: Emergency Medicine

## 2020-10-03 ENCOUNTER — Other Ambulatory Visit: Payer: Self-pay

## 2020-10-03 ENCOUNTER — Emergency Department (HOSPITAL_BASED_OUTPATIENT_CLINIC_OR_DEPARTMENT_OTHER): Payer: BC Managed Care – PPO

## 2020-10-03 ENCOUNTER — Encounter (HOSPITAL_BASED_OUTPATIENT_CLINIC_OR_DEPARTMENT_OTHER): Payer: Self-pay

## 2020-10-03 DIAGNOSIS — R519 Headache, unspecified: Secondary | ICD-10-CM | POA: Insufficient documentation

## 2020-10-03 DIAGNOSIS — R0789 Other chest pain: Secondary | ICD-10-CM | POA: Insufficient documentation

## 2020-10-03 DIAGNOSIS — Z8616 Personal history of COVID-19: Secondary | ICD-10-CM | POA: Insufficient documentation

## 2020-10-03 DIAGNOSIS — R202 Paresthesia of skin: Secondary | ICD-10-CM | POA: Diagnosis not present

## 2020-10-03 DIAGNOSIS — R002 Palpitations: Secondary | ICD-10-CM | POA: Diagnosis not present

## 2020-10-03 DIAGNOSIS — I479 Paroxysmal tachycardia, unspecified: Secondary | ICD-10-CM | POA: Diagnosis not present

## 2020-10-03 DIAGNOSIS — Z87891 Personal history of nicotine dependence: Secondary | ICD-10-CM | POA: Insufficient documentation

## 2020-10-03 DIAGNOSIS — R079 Chest pain, unspecified: Secondary | ICD-10-CM

## 2020-10-03 LAB — BASIC METABOLIC PANEL
Anion gap: 8 (ref 5–15)
BUN: 16 mg/dL (ref 6–20)
CO2: 29 mmol/L (ref 22–32)
Calcium: 9.2 mg/dL (ref 8.9–10.3)
Chloride: 104 mmol/L (ref 98–111)
Creatinine, Ser: 0.87 mg/dL (ref 0.61–1.24)
GFR, Estimated: >60 mL/min (ref >60)
Glucose, Bld: 92 mg/dL (ref 70–99)
Potassium: 4.5 mmol/L (ref 3.5–5.1)
Sodium: 141 mmol/L (ref 135–145)

## 2020-10-03 LAB — CBC
HCT: 45.6 % (ref 39.0–52.0)
Hemoglobin: 15.5 g/dL (ref 13.0–17.0)
MCH: 30.6 pg (ref 26.0–34.0)
MCHC: 34 g/dL (ref 30.0–36.0)
MCV: 90.1 fL (ref 80.0–100.0)
Platelets: 212 10*3/uL (ref 150–400)
RBC: 5.06 MIL/uL (ref 4.22–5.81)
RDW: 13.2 % (ref 11.5–15.5)
WBC: 5.9 10*3/uL (ref 4.0–10.5)
nRBC: 0 % (ref 0.0–0.2)

## 2020-10-03 LAB — TROPONIN I (HIGH SENSITIVITY): Troponin I (High Sensitivity): 5 ng/L (ref <18)

## 2020-10-03 MED ORDER — KETOROLAC TROMETHAMINE 30 MG/ML IJ SOLN
30.0000 mg | Freq: Once | INTRAMUSCULAR | Status: AC
  Administered 2020-10-03: 30 mg via INTRAVENOUS
  Filled 2020-10-03: qty 1

## 2020-10-03 NOTE — ED Notes (Signed)
Pt ambulatory to waiting room. Pt verbalized understanding of discharge instructions.   

## 2020-10-03 NOTE — ED Triage Notes (Signed)
Pt c/o tingling in hands and toes the past 2 mornings. Also c/o chest pain when mowing lawn yesterday w/ HR increasing to 140's. States cardiologist says he may have myocarditis.

## 2020-10-03 NOTE — ED Provider Notes (Signed)
[facility] EMERGENCY DEPARTMENT Provider Note   CSN: [id number] Arrival date & time: 10/03/20  9675     History Chief Complaint  Patient presents with   Chest Pain   Extremity Tingling    Christian Horne is a 45 y.o. male.  Christian Horne had COVID-19 in January 2022.  He has since developed long-lasting symptoms.  He complains of paresthesias in his hands and feet.  He has also had frequent chest pain.  He has been seen by cardiology, and he has had a normal echo.  He has also worn a Zio patch and had some very brief episodes of SVT.  He was treated for possible pericarditis/myocarditis.  Yesterday, while mowing the lawn, he has some chest pain and his usual paresthesias.  He states that he would not have thought about this as atypical, but for approximately 10 seconds, his heart rate shot up into the 140s.  He went to an urgent care today and was sent to the hospital for further evaluation.  He is currently feeling okay except for a headache.  Headache is frontal in location and has been present since yesterday.   The history is provided by the patient.  Chest Pain Pain location:  L lateral chest and R lateral chest Pain quality comment:  Like something is floating Pain radiates to:  Does not radiate Pain severity:  Mild Onset quality:  Sudden Duration: minutes. 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 No pertinent family history.  Social History   Tobacco Use   Smoking status: Former    Types: Cigarettes    Quit date: 03/14/2000    Years since quitting: 20.5   Smokeless tobacco: Never  Vaping Use   Vaping Use: Never used  Substance Use Topics   Alcohol use: Not Currently    Comment: occasionally   Drug use: No    Home Medications Prior to Admission medications   Medication Sig Start Date End Date Taking? Authorizing Provider  hydrOXYzine (ATARAX/VISTARIL) 25 MG tablet Take 1 tablet (25 mg total) by mouth every 6 (six) hours as needed for anxiety. 04/08/20   Robinson, [place] N, PA-C  methocarbamol (ROBAXIN) 500 MG tablet Take 1 tablet (500 mg total) by mouth 3 (three) times daily. 04/04/20   Gailen Shelter, PA  NONFORMULARY OR COMPOUNDED ITEM [facility]:  Onychomycosis Nail Lacquer - Fluconazole 2%, Terbinafine  1%, DMSO, apply to affected area daily. 11/10/16   Vivi Barrack, DPM  oxyCODONE-acetaminophen (ROXICET) 5-325 MG per tablet Take 1-2 tablets by mouth every 6 (six) hours as needed for pain. Patient not taking: Reported on 10/25/2016 10/26/12   Teryl Lucy, MD  promethazine (PHENERGAN) 25 MG tablet Take 1 tablet (25 mg total) by mouth every 6 (six) hours as needed for nausea. Patient not taking: Reported on 10/25/2016 10/26/12   Teryl Lucy, MD  sennosides-docusate sodium (SENOKOT-S) 8.6-50 MG tablet Take 2 tablets by mouth daily. Patient not taking: Reported on 10/25/2016  10/26/12   Teryl Lucy, MD  terbinafine (LAMISIL) 250 MG tablet Take 1 tablet (250 mg total) by mouth daily. 11/04/16   Vivi Barrack, DPM    Allergies    Terbinafine and Nortriptyline  Review of Systems   Review of Systems  Constitutional:  Negative for chills and fever.  HENT:  Negative for ear pain and sore throat.   Eyes:  Negative for pain and visual disturbance.  Respiratory:  Negative for cough and shortness of breath.   Cardiovascular:  Positive for chest pain and palpitations. Negative for syncope.  Gastrointestinal:  Negative for abdominal pain, nausea and vomiting.  Genitourinary:  Negative for dysuria and hematuria.  Musculoskeletal:  Negative for arthralgias and back pain.  Skin:  Negative for color change and rash.  Neurological:  Positive for headaches. Negative for seizures and syncope.  All other systems reviewed and are negative.  Physical Exam Updated Vital Signs BP 125/87   Pulse (!) 48   Temp 98.3 F (36.8 C) (Oral)   Resp 17   Ht 5\' 10"  (1.778 m)   Wt 86.6 kg   SpO2 100%   BMI 27.41 kg/m   Physical Exam Vitals and nursing note reviewed.  Constitutional:      Appearance: Normal appearance.  HENT:     Head: Normocephalic and atraumatic.  Cardiovascular:     Rate and Rhythm: Normal rate and regular rhythm.     Heart sounds: Normal heart sounds.  Pulmonary:     Effort: Pulmonary effort is normal.     Breath sounds: Normal breath sounds.  Abdominal:     General: There is no distension.     Tenderness: There is no abdominal tenderness. There is no guarding.  Musculoskeletal:     Cervical back: Normal range of motion.     Right lower leg: No edema.     Left lower leg: No edema.  Skin:    General: Skin is warm and dry.  Neurological:     General: No focal deficit present.     Mental Status: He is alert and oriented to person, place, and time.  Psychiatric:        Mood and Affect: Mood normal.        Behavior: Behavior normal.    ED  Results / Procedures / Treatments   Labs (all labs ordered are listed, but only abnormal results are displayed) Labs Reviewed  BASIC METABOLIC PANEL  CBC  TROPONIN I (HIGH SENSITIVITY)    EKG EKG Interpretation  Date/Time:  Saturday October 03 2020 10:07:45 EDT Ventricular Rate:  61 PR Interval:  146 QRS Duration: 104 QT Interval:  432 QTC Calculation: 434 R Axis:   76 Text Interpretation: Normal sinus rhythm Normal ECG normal axis No acute ischemia Confirmed by 04-07-1994 ([id number]) on 10/03/2020 11:44:23 AM  Radiology DG Chest Port 1 View  Result Date: 10/03/2020 CLINICAL DATA:  Chest pain.  EXAM: PORTABLE CHEST 1 VIEW COMPARISON:  04/08/2020 FINDINGS: 1145 hours. The lungs are clear without focal pneumonia, edema, pneumothorax or pleural effusion. Cardiopericardial silhouette is borderline to mildly enlarged. The visualized bony structures of the thorax show no acute abnormality. Telemetry leads overlie the chest. IMPRESSION: Stable. Borderline to mild cardiomegaly. No acute cardiopulmonary findings. Electronically Signed   By: Kennith Center M.D.   On: 10/03/2020 12:09    Procedures Procedures   Medications Ordered in ED Medications - No data to display  ED Course  I have reviewed the triage vital signs and the nursing notes.  Pertinent labs & imaging results that were available during my care of the patient were reviewed by me and considered in my medical decision making (see chart for details).    MDM Rules/Calculators/A&P                           Christian Horne presents approximately 24 hours after an episode of brief tachycardia and chest pain.  The chest pain has been recurrent since COVID-19 6 months ago.  He is actually bradycardic here which is his norm secondary to his high physical activity level.  Labs are reassuring including troponin.  1 troponin value was drawn since his symptoms were 24 hours ago and onset.  He has cardiology follow-up next week, and I recommended he  discuss further options for treatment with his cardiologist. Final Clinical Impression(s) / ED Diagnoses Final diagnoses:  Chest pain, unspecified type  Tachycardia, paroxysmal [facility])    Rx / DC Orders ED Discharge Orders     None        Koleen Distance, MD 10/03/20 1308

## 2021-07-21 ENCOUNTER — Encounter (HOSPITAL_BASED_OUTPATIENT_CLINIC_OR_DEPARTMENT_OTHER): Payer: Self-pay | Admitting: Emergency Medicine

## 2021-07-21 ENCOUNTER — Emergency Department (HOSPITAL_BASED_OUTPATIENT_CLINIC_OR_DEPARTMENT_OTHER): Payer: BC Managed Care – PPO

## 2021-07-21 ENCOUNTER — Emergency Department (HOSPITAL_BASED_OUTPATIENT_CLINIC_OR_DEPARTMENT_OTHER)
Admission: EM | Admit: 2021-07-21 | Discharge: 2021-07-21 | Disposition: A | Discharge status: Home / Self Care | Payer: BC Managed Care – PPO | Attending: Emergency Medicine | Admitting: Emergency Medicine

## 2021-07-21 ENCOUNTER — Other Ambulatory Visit: Payer: Self-pay

## 2021-07-21 DIAGNOSIS — R42 Dizziness and giddiness: Secondary | ICD-10-CM | POA: Diagnosis present

## 2021-07-21 DIAGNOSIS — R61 Generalized hyperhidrosis: Secondary | ICD-10-CM | POA: Diagnosis not present

## 2021-07-21 DIAGNOSIS — R079 Chest pain, unspecified: Secondary | ICD-10-CM | POA: Diagnosis not present

## 2021-07-21 DIAGNOSIS — Z8616 Personal history of COVID-19: Secondary | ICD-10-CM | POA: Diagnosis not present

## 2021-07-21 DIAGNOSIS — R001 Bradycardia, unspecified: Secondary | ICD-10-CM | POA: Diagnosis not present

## 2021-07-21 LAB — CBC
HCT: 43.4 % (ref 39.0–52.0)
Hemoglobin: 14.7 g/dL (ref 13.0–17.0)
MCH: 30.6 pg (ref 26.0–34.0)
MCHC: 33.9 g/dL (ref 30.0–36.0)
MCV: 90.4 fL (ref 80.0–100.0)
Platelets: 250 10*3/uL (ref 150–400)
RBC: 4.8 MIL/uL (ref 4.22–5.81)
RDW: 13.4 % (ref 11.5–15.5)
WBC: 8.1 10*3/uL (ref 4.0–10.5)
nRBC: 0 % (ref 0.0–0.2)

## 2021-07-21 LAB — BASIC METABOLIC PANEL
Anion gap: 7 (ref 5–15)
BUN: 19 mg/dL (ref 6–20)
CO2: 26 mmol/L (ref 22–32)
Calcium: 9.2 mg/dL (ref 8.9–10.3)
Chloride: 105 mmol/L (ref 98–111)
Creatinine, Ser: 0.97 mg/dL (ref 0.61–1.24)
GFR, Estimated: >60 mL/min (ref >60)
Glucose, Bld: 101 mg/dL — ABNORMAL HIGH (ref 70–99)
Potassium: 4.2 mmol/L (ref 3.5–5.1)
Sodium: 138 mmol/L (ref 135–145)

## 2021-07-21 LAB — TROPONIN I (HIGH SENSITIVITY)
Troponin I (High Sensitivity): 4 ng/L (ref <18)
Troponin I (High Sensitivity): 4 ng/L (ref <18)

## 2021-07-21 NOTE — ED Notes (Signed)
Pt ambulated around the department , pulse remained regular at 60 BPM  ?

## 2021-07-21 NOTE — ED Notes (Signed)
Pt reports dizziness with change of position to standing ?

## 2021-07-21 NOTE — ED Triage Notes (Signed)
Woke up this Am with left chest sharp pain radiating to armpit. Sees a cardiologist , not on any meds. ?Adds feels very dizzy .  ?

## 2021-07-21 NOTE — Discharge Instructions (Signed)
You are seen in the ER for dizziness. ? ?We extensively reviewed your cardiac history and also discussed the case with her cardiologist and neurologist.  At this time, there is no concern for acute stroke or cardiac abnormality that requires pacemaker emergently. ? ?That being said, the event monitor that has been ordered by your primary care doctor is essential neck step followed by following up with cardiology service.  Our team has contacted [facility] health to request that you are seen a bit sooner than July for your symptoms.  If they are unable to accomplish close follow-up, consider following up with: Cardiovascular group. ? ?We recommend that you avoid strenuous activity until cleared by cardiology service. ?We recommend that you avoid driving or operating heavy machinery until cleared by cardiology. ? ?Return to the ER if your symptoms get worse, you have near fainting or fainting spell, severe chest pain. ? ?It is unclear why you are having night sweats, we recommend that you continue follow-up with your primary care doctor for it. ? ?

## 2021-07-21 NOTE — ED Provider Notes (Signed)
?[facility] EMERGENCY DEPARTMENT ?Provider Note ? ? ?CSN: [id number] ?Arrival date & time: 07/21/21  9150 ? ?  ? ?History ? ?Chief Complaint  ?Patient presents with  ? Chest Pain  ? ? ?Christian Horne is a 46 y.o. male. ? ?HPI ? ?  ?46 year old male comes to the ER with chief complaint of left-sided chest pain and dizziness. ? ?His primary complaint is dizziness and night sweats. ? ?Patient indicates that when he woke up on Saturday, he had night sweats.  That pattern has continued since then.  Also he has started having constant dizziness since Saturday, and his symptoms are more pronounced when he is walking. ? ?Patient has no significant medical history.  He takes fiber supplement, no other supplement use and no concerning substance use disorder.  Family history is positive for cancer in his father in 67s, it was skin cancer.  No cardiovascular concerns in the family at young age.  No pacemakers in the family. ? ?Patient denies any focal numbness, weakness, vision change, slurred speech.  He denies any spinning sensation. ? ?Patient saw his PCP.  Event monitor has been ordered.  He called his cardiologist, they cannot see him until July. ? ?Patient was seen by cardiologist last year after COVID.  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  He had a stress test which was reassuring. ? ? ?Home Medications ?Prior to Admission medications   ?Medication Sig Start Date End Date Taking? Authorizing Provider  ?hydrOXYzine (ATARAX/VISTARIL) 25 MG tablet Take 1 tablet (25 mg total) by mouth every 6 (six) hours as needed for anxiety. 04/08/20   Robinson, [place] N, PA-C  ?methocarbamol (ROBAXIN) 500 MG tablet Take 1 tablet (500 mg total) by mouth 3 (three) times daily. 04/04/20   Gailen Shelter, PA  ?[facility]FORMULARY OR COMPOUNDED ITEM [facility]:  Onychomycosis Nail Lacquer - Fluconazole 2%, Terbinafine 1%, DMSO, apply to affected area daily. 11/10/16   Vivi Barrack, DPM  ?oxyCODONE-acetaminophen (ROXICET)  5-325 MG per tablet Take 1-2 tablets by mouth every 6 (six) hours as needed for pain. ?Patient not taking: Reported on 10/25/2016 10/26/12   Teryl Lucy, MD  ?promethazine (PHENERGAN) 25 MG tablet Take 1 tablet (25 mg total) by mouth every 6 (six) hours as needed for nausea. ?Patient not taking: Reported on 10/25/2016 10/26/12   Teryl Lucy, MD  ?sennosides-docusate sodium (SENOKOT-S) 8.6-50 MG tablet Take 2 tablets by mouth daily. ?Patient not taking: Reported on 10/25/2016 10/26/12   Teryl Lucy, MD  ?terbinafine (LAMISIL) 250 MG tablet Take 1 tablet (250 mg total) by mouth daily. 11/04/16   Vivi Barrack, DPM  ?   ? ?Allergies    ?Terbinafine and Nortriptyline   ? ?Review of Systems   ?Review of Systems  ?All other systems reviewed and are negative. ? ?Physical Exam ?Updated Vital Signs ?BP 126/87   Pulse (!) 41   Temp 98 ?F (36.7 ?C) (Oral)   Resp 16   Ht 5\' 10"  (1.778 m)   Wt 84.4 kg   SpO2 97%   BMI 26.69 kg/m?  ?Physical Exam ?Vitals and nursing note reviewed.  ?Constitutional:   ?   Appearance: He is well-developed.  ?HENT:  ?   Head: Atraumatic.  ?Eyes:  ?   Extraocular Movements: Extraocular movements intact.  ?   Pupils: Pupils are equal, round, and reactive to light.  ?   Comments: No nystagmus  ?Cardiovascular:  ?   Rate and Rhythm: Bradycardia present.  ?  Heart sounds: Normal heart sounds.  ?Pulmonary:  ?   Effort: Pulmonary effort is normal.  ?Musculoskeletal:  ?   Cervical back: Neck supple.  ?Skin: ?   General: Skin is warm.  ?Neurological:  ?   Mental Status: He is alert and oriented to person, place, and time.  ?   Cranial Nerves: No cranial nerve deficit.  ?   Motor: No weakness.  ?   Comments: No dysmetria, patient ambulated he did not have ataxic gait  ? ? ?ED Results / Procedures / Treatments   ?Labs ?(all labs ordered are listed, but only abnormal results are displayed) ?Labs Reviewed  ?BASIC METABOLIC PANEL - Abnormal; Notable for the following components:  ?    Result Value   ? Glucose, Bld 101 (*)   ? All other components within normal limits  ?CBC  ?TROPONIN I (HIGH SENSITIVITY)  ?TROPONIN I (HIGH SENSITIVITY)  ? ? ?EKG ?EKG Interpretation ? ?Date/Time:  Wednesday Jul 21 2021 07:22:55 EDT ?Ventricular Rate:  40 ?PR Interval:  148 ?QRS Duration: 104 ?QT Interval:  476 ?QTC Calculation: 387 ?R Axis:   48 ?Text Interpretation: Marked sinus bradycardia with sinus arrhythmia Abnormal ECG When compared with ECG of 03-Oct-2020 10:07, PREVIOUS ECG IS PRESENT No acute changes Confirmed by Derwood KaplanNanavati, Leean Amezcua ([id number]) [phone]) on 07/21/2021 8:07:17 AM ? ?Radiology ?DG Chest 2 View ? ?Result Date: 07/21/2021 ?CLINICAL DATA:  Chest pain,  sharp LEFT arm pain EXAM: CHEST - 2 VIEW COMPARISON:  None Available. FINDINGS: Normal mediastinum and cardiac silhouette. Normal pulmonary vasculature. No evidence of effusion, infiltrate, or pneumothorax. No acute bony abnormality. IMPRESSION: No acute cardiopulmonary process. Electronically Signed   By: Genevive BiStewart  Edmunds M.D.   On: 07/21/2021 07:49   ? ?Procedures ?Marland Kitchen.Critical Care ?Performed by: Derwood KaplanNanavati, Vona Whiters, MD ?Authorized by: Derwood KaplanNanavati, Inaya Gillham, MD  ? ?Critical care provider statement:  ?  Critical care time (minutes):  48 ?  Critical care was necessary to treat or prevent imminent or life-threatening deterioration of the following conditions:  Circulatory failure ?  Critical care was time spent personally by me on the following activities:  Development of treatment plan with patient or surrogate, discussions with consultants, evaluation of patient's response to treatment, examination of patient, ordering and review of laboratory studies, ordering and review of radiographic studies, ordering and performing treatments and interventions, pulse oximetry, re-evaluation of patient's condition and review of old charts  ? ? ?Medications Ordered in ED ?Medications - No data to display ? ?ED Course/ Medical Decision Making/ A&P ?Clinical Course as of 07/21/21 1220  ?Wed Jul 21, 2021   ?1220 [facility]  They will try to get the patient an earlier appointment. [AN]  ?1220 The patient appears reasonably screened and/or stabilized for discharge and I doubt any other medical condition or other [facility] requiring further screening, evaluation, or treatment in the ED at this time prior to discharge. ?  ?Results from the ER workup discussed with the patient face to face and all questions answered to the best of my ability. ?The patient is safe for discharge with strict return precautions. ? ? [AN]  ?  ?Clinical Course User Index ?[AN] Derwood KaplanNanavati, Alexx Mcburney, MD  ? ?                        ?Medical Decision Making ?Amount and/or Complexity of Data Reviewed ?Labs: ordered. ?Radiology: ordered. ? ? ?This patient presents to the ED with chief complaint(s) of constant dizziness, worse  with ambulation with pertinent past medical history of low resting heart rate which further complicates the presenting complaint.  ? ?Looks like patient had a stress test last year which was reassuring.  His echocardiogram did not show any structural changes. ? ?I have reviewed patient's previous office visits.  On both of his last PCP visits his heart rate was 40 and 41 respectively.  During his visit when the heart rate was 41, there was no concerns for dizziness. ? ?Patient neurologic exam is reassuring.  No nystagmus.  No ataxia. ? ?The differential diagnosis includes orthostasis, basilar perfusion syndrome given his low heart rate. ? ?Patient is also complaining of night sweats.  He denies any weight loss, bloody stools and had a colonoscopy that was normal a few years back.  His father had skin cancer, patient has not seen any new skin lesions.  Unsure why he is having night sweats.  There is no concerning travel history.  He is not having any fever here.  White count is normal.  Patient denies any tick bites.  He had been tested for ticks when he had his cardiac evaluation last year, and it was  normal. ? ?Unsure what to make of the night sweats at this time.  Paraneoplastic process is possible, but I do not think we will be able to pick that up in the ED. ? ?The initial plan is to discuss case with both card

## 2022-06-27 ENCOUNTER — Encounter: Payer: Self-pay | Admitting: *Deleted

## 2023-04-03 IMAGING — DX DG CHEST 1V PORT
1 series · 1 of 1 positions shown · non-contrast
Comparison: 04/08/2020

CLINICAL DATA: Chest pain.

EXAM:
PORTABLE CHEST 1 VIEW

[chest ap]
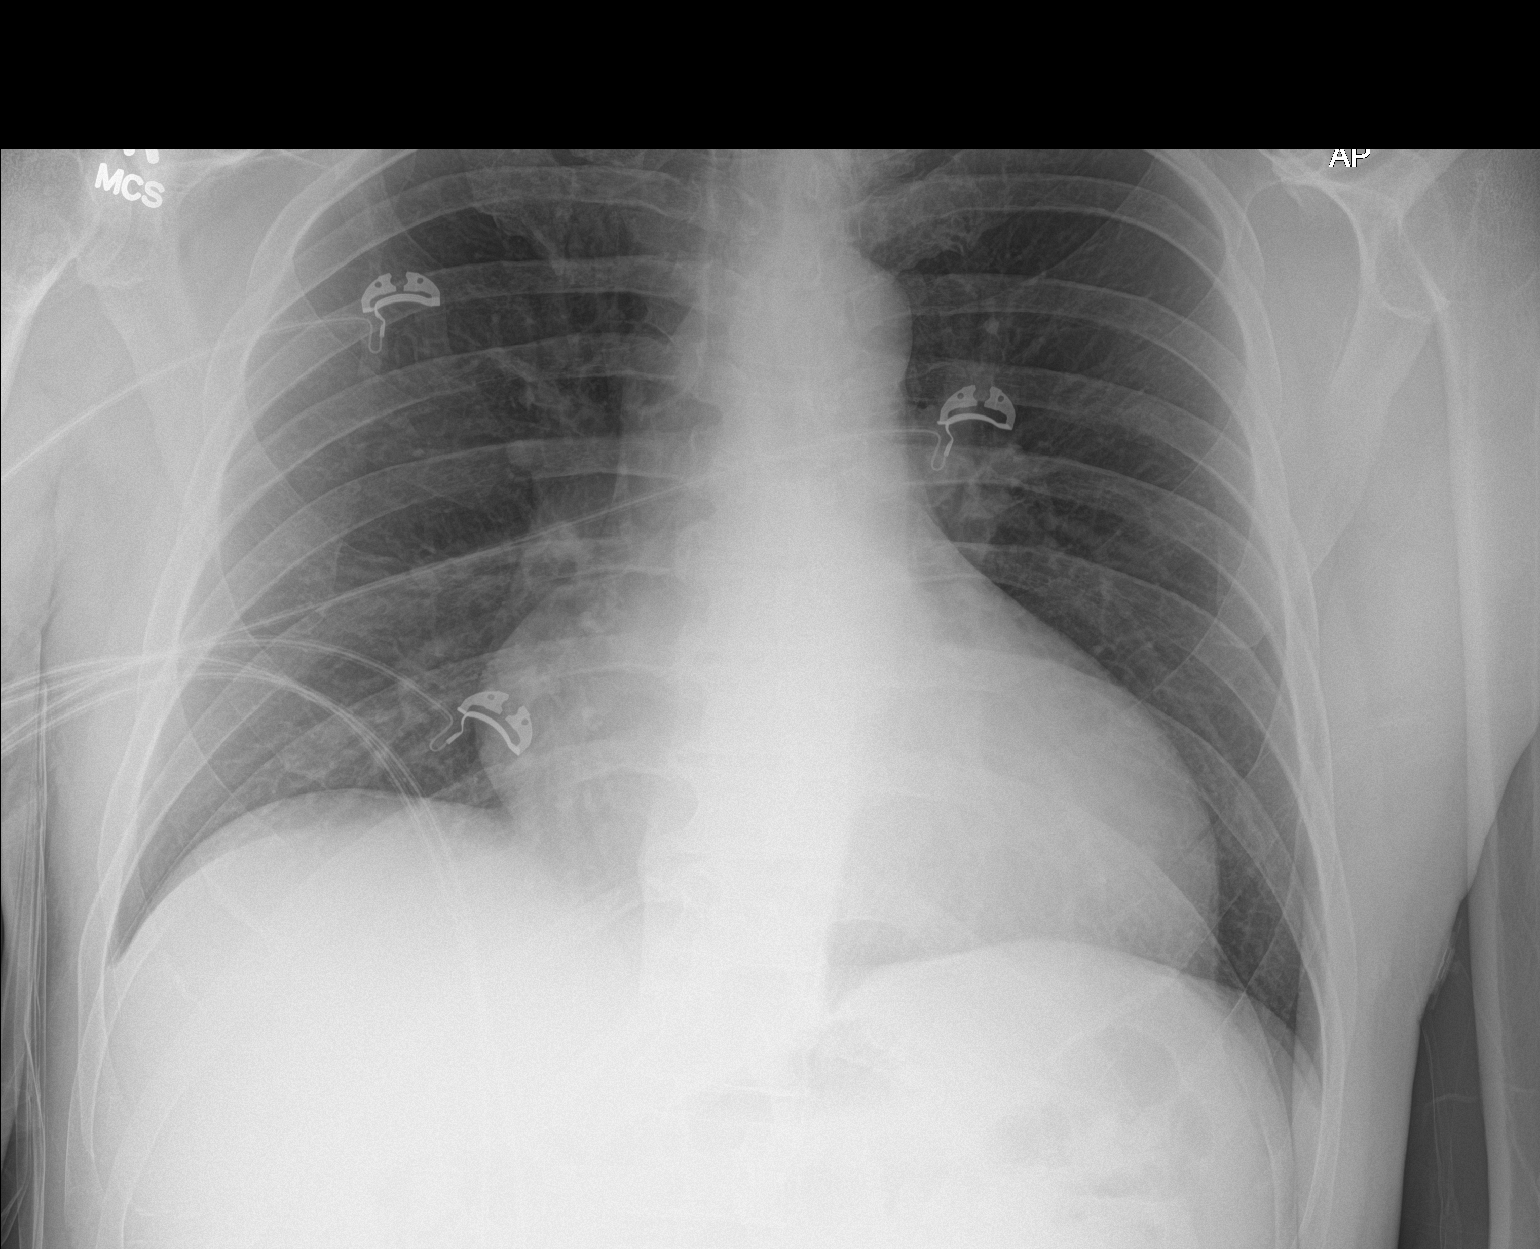

[1 of 1 positions shown; findings below may reference images not displayed]

FINDINGS: 9923 hours. The lungs are clear without focal pneumonia, edema,
pneumothorax or pleural effusion. Cardiopericardial silhouette is
borderline to mildly enlarged. The visualized bony structures of the
thorax show no acute abnormality. Telemetry leads overlie the chest.
IMPRESSION: Stable. Borderline to mild cardiomegaly. No acute cardiopulmonary
findings.

## 2024-01-19 IMAGING — DX DG CHEST 2V
2 series · 2 of 2 positions shown · non-contrast
Comparison: None Available.

CLINICAL DATA: Chest pain,  sharp LEFT arm pain

EXAM:
CHEST - 2 VIEW

[chest pa]
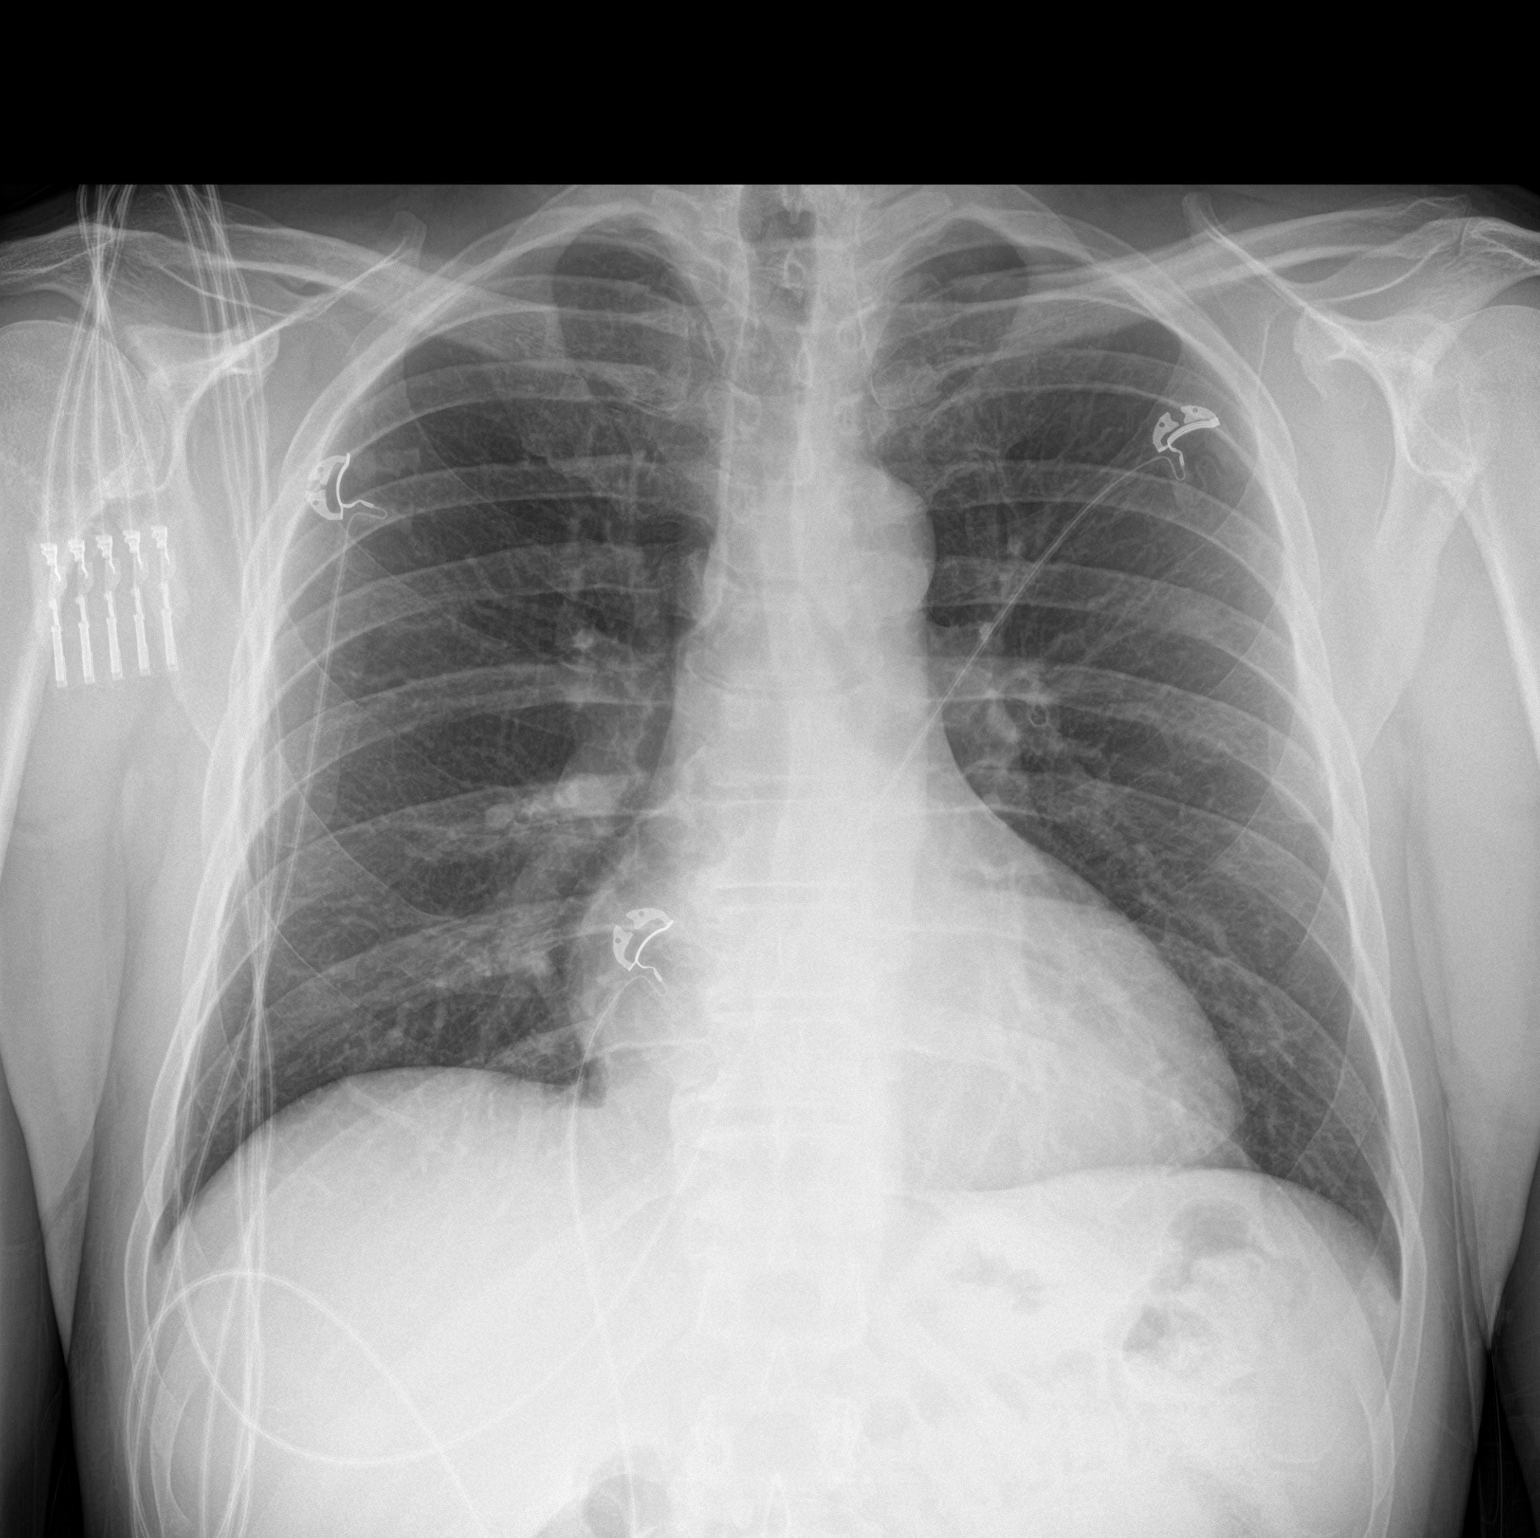

[chest lat]
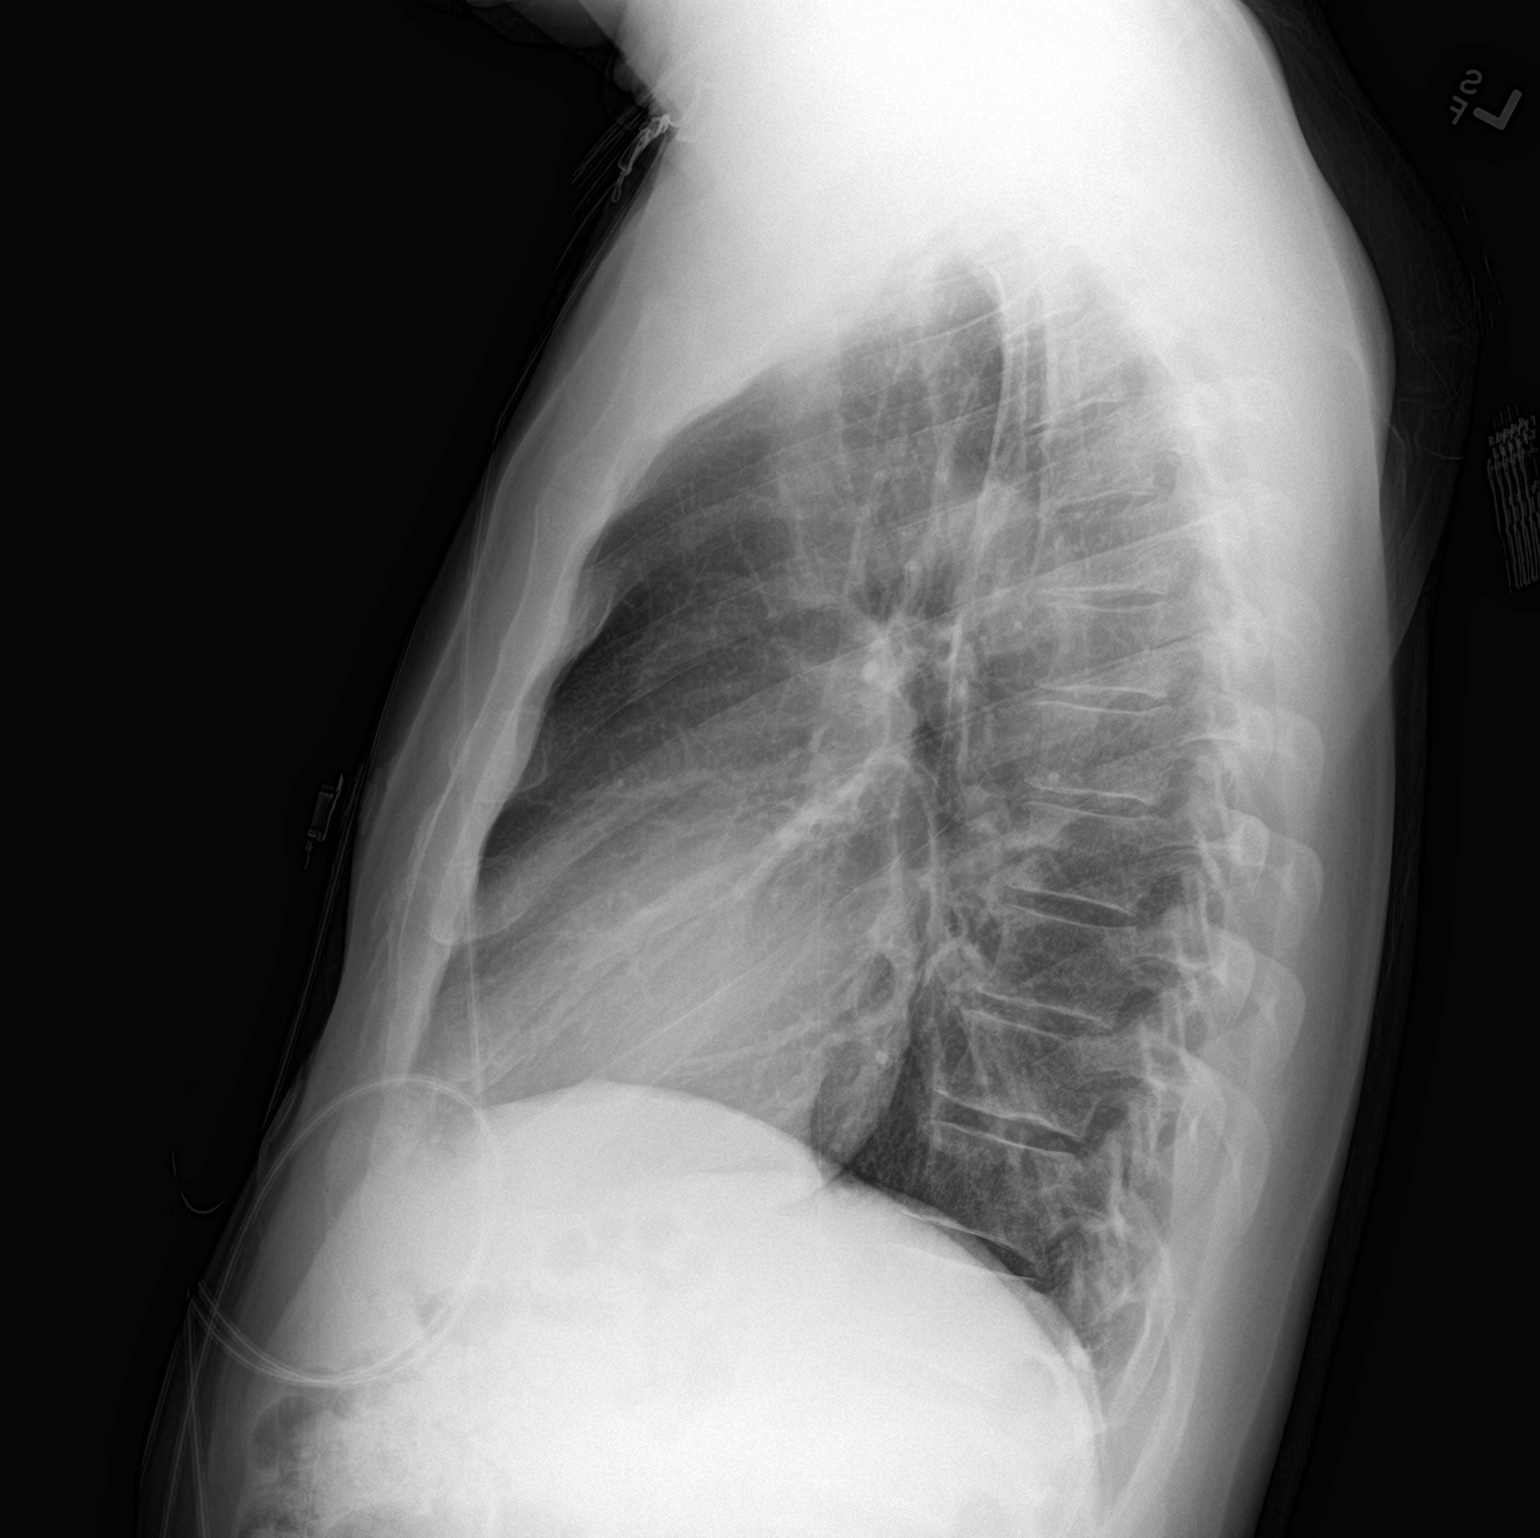

[2 of 2 positions shown; findings below may reference images not displayed]

FINDINGS: Normal mediastinum and cardiac silhouette. Normal pulmonary
vasculature. No evidence of effusion, infiltrate, or pneumothorax.
No acute bony abnormality.
IMPRESSION: No acute cardiopulmonary process.
